# Patient Record
Sex: Female | Born: 1980
Health system: Southern US, Community
[De-identification: ages and names within clinical notes are randomized; demographics above are authoritative.]

## PROBLEM LIST (undated history)

## (undated) DIAGNOSIS — F329 Major depressive disorder, single episode, unspecified: Secondary | ICD-10-CM

## (undated) DIAGNOSIS — I10 Essential (primary) hypertension: Secondary | ICD-10-CM

## (undated) DIAGNOSIS — E7800 Pure hypercholesterolemia, unspecified: Secondary | ICD-10-CM

## (undated) DIAGNOSIS — Z87828 Personal history of other (healed) physical injury and trauma: Secondary | ICD-10-CM

## (undated) DIAGNOSIS — K8020 Calculus of gallbladder without cholecystitis without obstruction: Secondary | ICD-10-CM

## (undated) DIAGNOSIS — K219 Gastro-esophageal reflux disease without esophagitis: Secondary | ICD-10-CM

## (undated) DIAGNOSIS — H04123 Dry eye syndrome of bilateral lacrimal glands: Secondary | ICD-10-CM

## (undated) DIAGNOSIS — F419 Anxiety disorder, unspecified: Secondary | ICD-10-CM

## (undated) DIAGNOSIS — F32A Depression, unspecified: Secondary | ICD-10-CM

## (undated) HISTORY — DX: Essential (primary) hypertension: I10

## (undated) HISTORY — DX: Depression, unspecified: F32.A

## (undated) HISTORY — DX: Anxiety disorder, unspecified: F41.9

## (undated) HISTORY — DX: Personal history of other (healed) physical injury and trauma: Z87.828

## (undated) HISTORY — PX: CHOLECYSTECTOMY: SHX55

## (undated) HISTORY — DX: Gastro-esophageal reflux disease without esophagitis: K21.9

## (undated) HISTORY — DX: Major depressive disorder, single episode, unspecified: F32.9

## (undated) HISTORY — DX: Dry eye syndrome of bilateral lacrimal glands: H04.123

## (undated) HISTORY — DX: Calculus of gallbladder without cholecystitis without obstruction: K80.20

## (undated) HISTORY — PX: REFRACTIVE SURGERY: SHX103

## (undated) HISTORY — DX: Pure hypercholesterolemia, unspecified: E78.00

---

## 2012-08-26 HISTORY — PX: TUBAL LIGATION: SHX77

## 2016-10-12 ENCOUNTER — Ambulatory Visit: Payer: Self-pay | Admitting: Primary Care

## 2016-12-28 ENCOUNTER — Ambulatory Visit (INDEPENDENT_AMBULATORY_CARE_PROVIDER_SITE_OTHER): Payer: 59 | Admitting: Family Medicine

## 2016-12-28 ENCOUNTER — Encounter: Payer: Self-pay | Admitting: Family Medicine

## 2016-12-28 VITALS — BP 124/82 | HR 103 | Temp 98.0°F | Ht 62.5 in | Wt 147.5 lb

## 2016-12-28 DIAGNOSIS — F419 Anxiety disorder, unspecified: Secondary | ICD-10-CM | POA: Diagnosis not present

## 2016-12-28 DIAGNOSIS — M545 Low back pain, unspecified: Secondary | ICD-10-CM

## 2016-12-28 DIAGNOSIS — G8929 Other chronic pain: Secondary | ICD-10-CM

## 2016-12-28 DIAGNOSIS — B350 Tinea barbae and tinea capitis: Secondary | ICD-10-CM

## 2016-12-28 DIAGNOSIS — D649 Anemia, unspecified: Secondary | ICD-10-CM

## 2016-12-28 DIAGNOSIS — Z7689 Persons encountering health services in other specified circumstances: Secondary | ICD-10-CM | POA: Diagnosis not present

## 2016-12-28 MED ORDER — TRAMADOL HCL 50 MG PO TABS: 50.0000 mg | ORAL_TABLET | Freq: Two times a day (BID) | ORAL | 0 refills | Status: DC | PRN

## 2016-12-28 MED ORDER — ALPRAZOLAM 0.5 MG PO TABS: 0.2500 mg | ORAL_TABLET | Freq: Two times a day (BID) | ORAL | 0 refills | Status: DC | PRN

## 2016-12-28 MED ORDER — GRISEOFULVIN ULTRAMICROSIZE 125 MG PO TABS: 375.0000 mg | ORAL_TABLET | Freq: Every day | ORAL | 1 refills | Status: DC

## 2016-12-28 MED ORDER — CYCLOBENZAPRINE HCL 10 MG PO TABS: 10.0000 mg | ORAL_TABLET | Freq: Two times a day (BID) | ORAL | 0 refills | Status: DC | PRN

## 2016-12-28 NOTE — Progress Notes (Signed)
Subjective:    Patient ID: Tina Schmidt, female    DOB: [birth year], 36 y.o.   MRN: [id number]  HPI This is 36 yo female who presents today to establish care. She is married and has 3 boys. 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 She enjoys playing games, dancing and singing.   Tinea capitis- Has had an intermittent fungal infection of scalp. Has used ketaconazole shampoo, but it doesn't seem to work any more. Very itchy and scaly. Has seen dermatology in past who diagnosed her. She is interested in trying oral antifungal. Has excessive flaking and hair loss. Flaking seems to have spread to eyebrows. She wears a wig due to extensive hair loss.   Chronic low back pain- for several years, bothers her when she does excessive heavy lifting at work. Takes occasional tramadol and cyclobenzaprine- 2-4 times per month.   Anxiety- Lost her grandmother last year and has had increased anxiety. Feels that this is improving and she has gone from taking alprazolam 3/x day to 1-2 times per week as needed for sleep.   Had last CPE 07/2015. Last PAP- 2013 normal   Past Medical History:  Diagnosis Date  . Depression   . GERD (gastroesophageal reflux disease)   . Hypertension     Family History  Problem Relation Age of Onset  . Asthma Mother   . Hyperlipidemia Mother   . Hypertension Mother   . Hypertension Father    Social History   Tobacco Use  . Smoking status: Former Games developermoker  . Smokeless tobacco: Never Used  Substance Use Topics  . Alcohol use: Yes    Comment: occ  . Drug use: No      Review of Systems Per HPI    Objective:   Physical Exam  Constitutional: She is oriented to person, place, and time. She appears well-developed and well-nourished. No distress.  HENT:  Head: Normocephalic and atraumatic.  Eyes: Conjunctivae are normal.  Neck: Normal range of motion. Neck supple.  Cardiovascular: Normal rate.  Pulmonary/Chest: Effort normal.  Neurological: She is alert and oriented  to person, place, and time.  Skin: Skin is warm and dry. She is not diaphoretic.  Very receded hair line. Scalp pale and flaky. Forehead slightly flaky.   Psychiatric: She has a normal mood and affect. Her behavior is normal. Judgment and thought content normal.  Vitals reviewed.     BP 124/82 (BP Location: Right Arm, Patient Position: Sitting, Cuff Size: Small)   Pulse (!) 103   Temp 98 F (36.7 C) (Oral)   Ht 5' 2.5" (1.588 m)   Wt 147 lb 8 oz (66.9 kg)   LMP 12/05/2016   SpO2 98%   BMI 26.55 kg/m      Assessment & Plan:  1. Encounter to establish care - Discussed and encouraged healthy lifestyle choices- adequate sleep, regular exercise, stress management and healthy food choices.  - follow up in 1-2 months for CPE with pap  2. Tinea capitis - if no improvement with treatment, will refer to dermatology - provided written information regarding griseofulvin - griseofulvin (GRIS-PEG) 125 MG tablet; Take 3 tablets (375 mg total) by mouth daily.  Dispense: 90 tablet; Refill: 1 - Comprehensive metabolic panel  3. Anemia, unspecified type - reports history of anemia, will check today - CBC  4. Chronic midline low back pain without sciatica - discussed limited use of tramadol and encouraged her to use proper body mechanics with lifting and to do yoga/stretching on regular  basis. Provided written information about back exercises - cyclobenzaprine (FLEXERIL) 10 MG tablet; Take 1 tablet (10 mg total) by mouth 2 (two) times daily as needed for muscle spasms.  Dispense: 20 tablet; Refill: 0 - traMADol (ULTRAM) 50 MG tablet; Take 1 tablet (50 mg total) by mouth 2 (two) times daily as needed.  Dispense: 20 tablet; Refill: 0  5. Anxiety - discussed occasional use os alprazolam and encouraged her to RTC if increased anxiety- can discuss therapy, daily medication.  - ALPRAZolam (XANAX) 0.5 MG tablet; Take 0.5-1 tablets (0.25-0.5 mg total) by mouth 2 (two) times daily as needed for  anxiety.  Dispense: 20 tablet; Refill: 0   Olean Reeeborah Conni Knighton, FNP-BC  [facility], [facility]  12/28/2016 5:48 PM

## 2016-12-28 NOTE — Patient Instructions (Signed)
It was a pleasure to meet you today! I look forward to partnering with you for your health care needs  Please schedule your complete physical for Jan/Feb  Griseofulvin tablets or capsules What is this medicine? GRISEOFULVIN (gri see oh FUL vin) is an antifungal medicine. It is used to treat certain kinds of fungal or yeast infections of the skin, hair, or nails. This medicine may be used for other purposes; ask your health care provider or pharmacist if you have questions. COMMON BRAND NAME(S): Fulvicin P/G, Fulvicin U/F, Grifulvin V, Gris-Peg, Grisactin What should I tell my health care provider before I take this medicine? They need to know if you have any of these conditions: -liver disease -porphyria -systemic lupus erythematosus (SLE) -an unusual or allergic reaction to griseofulvin, penicillin, other foods, dyes or preservatives -pregnant or trying to get pregnant -breast-feeding How should I use this medicine? Take this medicine by mouth with a glass of water. Follow the directions on the prescription label. Take with or without food. Take your medicine at regular intervals. Do not take your medicine more often than directed. Do not skip doses or stop your medicine early even if you feel better. Do not stop taking except on your doctor's advice. Talk to your pediatrician regarding the use of this medicine in children. While this drug may be prescribed for selected conditions, precautions do apply. Overdosage: If you think you have taken too much of this medicine contact a poison control center or emergency room at once. NOTE: This medicine is only for you. Do not share this medicine with others. What if I miss a dose? If you miss a dose, take it as soon as you can. If it is almost time for your next dose, take only that dose. Do not take double or extra doses. What may interact with this medicine? -aspirin and aspirin-like medicines -barbiturate medicines for sleep or  seizures -cyclosporine -female hormones, like estrogens or progestins and birth control pills -warfarin This list may not describe all possible interactions. Give your health care provider a list of all the medicines, herbs, non-prescription drugs, or dietary supplements you use. Also tell them if you smoke, drink alcohol, or use illegal drugs. Some items may interact with your medicine. What should I watch for while using this medicine? Visit your doctor or health care professional for regular check ups. Tell your doctor or health care professional if your symptoms do not improve or if they get worse. Some fungal infections need many weeks or months of treatment to cure. Follow your doctor's instructions on how to care for the infection. You may need to use another medicine on your skin while you are taking this medicine. This medicine can make you more sensitive to the sun. Keep out of the sun. If you cannot avoid being in the sun, wear protective clothing and use sunscreen. Do not use sun lamps or tanning beds/booths. Birth control pills may not work properly while you are taking this medicine. Talk to your doctor about using an extra method of birth control. What side effects may I notice from receiving this medicine? Side effects that you should report to your doctor or health care professional as soon as possible: -allergic reactions like skin rash or hives, swelling of the face, lips, or tongue -confusion -dark urine -fever or infection -loss of appetite -mouth sores, white patches -skin rash, redness, blistering, or peeling of skin -tingling or numbness in the hands or feet -trouble breathing -unusually weak or tired -  yellowing of skin or eyes Side effects that usually do not require medical attention (report to your doctor or health care professional if they continue or are bothersome): -difficulty sleeping -dizziness -headache -nausea, vomiting -stomach pain This list may not  describe all possible side effects. Call your doctor for medical advice about side effects. You may report side effects to FDA at 1-800-FDA-1088. Where should I keep my medicine? Keep out of the reach of children. Store at room temperature between 15 and 30 degrees C (59 and 86 degrees F). Keep container tightly closed. Throw away any unused medicine after the expiration date. NOTE: This sheet is a summary. It may not cover all possible information. If you have questions about this medicine, talk to your doctor, pharmacist, or health care provider.  2018 Elsevier/Gold Standard (2012-06-02 14:42:56)  Back Exercises The following exercises strengthen the muscles that help to support the back. They also help to keep the lower back flexible. Doing these exercises can help to prevent back pain or lessen existing pain. If you have back pain or discomfort, try doing these exercises 2-3 times each day or as told by your health care provider. When the pain goes away, do them once each day, but increase the number of times that you repeat the steps for each exercise (do more repetitions). If you do not have back pain or discomfort, do these exercises once each day or as told by your health care provider. Exercises Single Knee to Chest  Repeat these steps 3-5 times for each leg: 1. Lie on your back on a firm bed or the floor with your legs extended. 2. Bring one knee to your chest. Your other leg should stay extended and in contact with the floor. 3. Hold your knee in place by grabbing your knee or thigh. 4. Pull on your knee until you feel a gentle stretch in your lower back. 5. Hold the stretch for 10-30 seconds. 6. Slowly release and straighten your leg.  Pelvic Tilt  Repeat these steps 5-10 times: 1. Lie on your back on a firm bed or the floor with your legs extended. 2. Bend your knees so they are pointing toward the ceiling and your feet are flat on the floor. 3. Tighten your lower abdominal  muscles to press your lower back against the floor. This motion will tilt your pelvis so your tailbone points up toward the ceiling instead of pointing to your feet or the floor. 4. With gentle tension and even breathing, hold this position for 5-10 seconds.  Cat-Cow  Repeat these steps until your lower back becomes more flexible: 1. Get into a hands-and-knees position on a firm surface. Keep your hands under your shoulders, and keep your knees under your hips. You may place padding under your knees for comfort. 2. Let your head hang down, and point your tailbone toward the floor so your lower back becomes rounded like the back of a cat. 3. Hold this position for 5 seconds. 4. Slowly lift your head and point your tailbone up toward the ceiling so your back forms a sagging arch like the back of a cow. 5. Hold this position for 5 seconds.  Press-Ups  Repeat these steps 5-10 times: 1. Lie on your abdomen (face-down) on the floor. 2. Place your palms near your head, about shoulder-width apart. 3. While you keep your back as relaxed as possible and keep your hips on the floor, slowly straighten your arms to raise the top half of your  body and lift your shoulders. Do not use your back muscles to raise your upper torso. You may adjust the placement of your hands to make yourself more comfortable. 4. Hold this position for 5 seconds while you keep your back relaxed. 5. Slowly return to lying flat on the floor.  Bridges  Repeat these steps 10 times: 1. Lie on your back on a firm surface. 2. Bend your knees so they are pointing toward the ceiling and your feet are flat on the floor. 3. Tighten your buttocks muscles and lift your buttocks off of the floor until your waist is at almost the same height as your knees. You should feel the muscles working in your buttocks and the back of your thighs. If you do not feel these muscles, slide your feet 1-2 inches farther away from your buttocks. 4. Hold this  position for 3-5 seconds. 5. Slowly lower your hips to the starting position, and allow your buttocks muscles to relax completely.  If this exercise is too easy, try doing it with your arms crossed over your chest. Abdominal Crunches  Repeat these steps 5-10 times: 1. Lie on your back on a firm bed or the floor with your legs extended. 2. Bend your knees so they are pointing toward the ceiling and your feet are flat on the floor. 3. Cross your arms over your chest. 4. Tip your chin slightly toward your chest without bending your neck. 5. Tighten your abdominal muscles and slowly raise your trunk (torso) high enough to lift your shoulder blades a tiny bit off of the floor. Avoid raising your torso higher than that, because it can put too much stress on your low back and it does not help to strengthen your abdominal muscles. 6. Slowly return to your starting position.  Back Lifts Repeat these steps 5-10 times: 1. Lie on your abdomen (face-down) with your arms at your sides, and rest your forehead on the floor. 2. Tighten the muscles in your legs and your buttocks. 3. Slowly lift your chest off of the floor while you keep your hips pressed to the floor. Keep the back of your head in line with the curve in your back. Your eyes should be looking at the floor. 4. Hold this position for 3-5 seconds. 5. Slowly return to your starting position.  Contact a health care provider if:  Your back pain or discomfort gets much worse when you do an exercise.  Your back pain or discomfort does not lessen within 2 hours after you exercise. If you have any of these problems, stop doing these exercises right away. Do not do them again unless your health care provider says that you can. Get help right away if:  You develop sudden, severe back pain. If this happens, stop doing the exercises right away. Do not do them again unless your health care provider says that you can. This information is not intended to  replace advice given to you by your health care provider. Make sure you discuss any questions you have with your health care provider. Document Released: 02/09/2004 Document Revised: 05/11/2015 Document Reviewed: 02/25/2014 Elsevier Interactive Patient Education  2017 [employer]

## 2016-12-29 LAB — CBC
HCT: 35.8 % (ref 35.0–45.0)
HEMOGLOBIN: 12.1 g/dL (ref 11.7–15.5)
MCH: 27.5 pg (ref 27.0–33.0)
MCHC: 33.8 g/dL (ref 32.0–36.0)
MCV: 81.4 fL (ref 80.0–100.0)
MPV: 10.2 fL (ref 7.5–12.5)
PLATELETS: 352 10*3/uL (ref 140–400)
RBC: 4.4 10*6/uL (ref 3.80–5.10)
RDW: 13 % (ref 11.0–15.0)
WBC: 7.7 10*3/uL (ref 3.8–10.8)

## 2016-12-29 LAB — COMPREHENSIVE METABOLIC PANEL
AG RATIO: 1.5 (calc) (ref 1.0–2.5)
ALBUMIN MSPROF: 4.5 g/dL (ref 3.6–5.1)
ALT: 10 U/L (ref 6–29)
AST: 15 U/L (ref 10–30)
Alkaline phosphatase (APISO): 65 U/L (ref 33–115)
BUN: 11 mg/dL (ref 7–25)
CHLORIDE: 104 mmol/L (ref 98–110)
CO2: 30 mmol/L (ref 20–32)
CREATININE: 0.85 mg/dL (ref 0.50–1.10)
Calcium: 10.1 mg/dL (ref 8.6–10.2)
GLOBULIN: 3 g/dL (ref 1.9–3.7)
GLUCOSE: 92 mg/dL (ref 65–99)
POTASSIUM: 3.9 mmol/L (ref 3.5–5.3)
SODIUM: 140 mmol/L (ref 135–146)
TOTAL PROTEIN: 7.5 g/dL (ref 6.1–8.1)
Total Bilirubin: 0.3 mg/dL (ref 0.2–1.2)

## 2017-02-07 ENCOUNTER — Encounter: Payer: Self-pay | Admitting: Family Medicine

## 2017-02-07 ENCOUNTER — Other Ambulatory Visit: Payer: Self-pay | Admitting: Family Medicine

## 2017-02-07 DIAGNOSIS — M545 Low back pain: Secondary | ICD-10-CM

## 2017-02-07 DIAGNOSIS — G8929 Other chronic pain: Secondary | ICD-10-CM

## 2017-02-07 DIAGNOSIS — F419 Anxiety disorder, unspecified: Secondary | ICD-10-CM

## 2017-02-07 MED ORDER — CYCLOBENZAPRINE HCL 10 MG PO TABS: 10.0000 mg | ORAL_TABLET | Freq: Two times a day (BID) | ORAL | 0 refills | Status: DC | PRN

## 2017-02-07 MED ORDER — ALPRAZOLAM 0.5 MG PO TABS: 0.2500 mg | ORAL_TABLET | Freq: Two times a day (BID) | ORAL | 0 refills | Status: DC | PRN

## 2017-02-07 NOTE — Progress Notes (Signed)
Rx called in to requested pharmacy 

## 2017-02-15 ENCOUNTER — Other Ambulatory Visit: Payer: 59

## 2017-02-22 ENCOUNTER — Other Ambulatory Visit (HOSPITAL_COMMUNITY)
Admission: RE | Admit: 2017-02-22 | Discharge: 2017-02-22 | Disposition: A | Discharge status: Home / Self Care | Payer: 59 | Source: Ambulatory Visit | Attending: Family Medicine | Admitting: Family Medicine

## 2017-02-22 ENCOUNTER — Ambulatory Visit (INDEPENDENT_AMBULATORY_CARE_PROVIDER_SITE_OTHER): Payer: 59 | Admitting: Family Medicine

## 2017-02-22 ENCOUNTER — Encounter: Payer: Self-pay | Admitting: Family Medicine

## 2017-02-22 VITALS — BP 122/82 | HR 98 | Temp 98.0°F | Ht 62.5 in | Wt 146.5 lb

## 2017-02-22 DIAGNOSIS — Z124 Encounter for screening for malignant neoplasm of cervix: Secondary | ICD-10-CM | POA: Diagnosis present

## 2017-02-22 DIAGNOSIS — B372 Candidiasis of skin and nail: Secondary | ICD-10-CM | POA: Diagnosis not present

## 2017-02-22 DIAGNOSIS — K219 Gastro-esophageal reflux disease without esophagitis: Secondary | ICD-10-CM | POA: Insufficient documentation

## 2017-02-22 DIAGNOSIS — F329 Major depressive disorder, single episode, unspecified: Secondary | ICD-10-CM | POA: Insufficient documentation

## 2017-02-22 DIAGNOSIS — I10 Essential (primary) hypertension: Secondary | ICD-10-CM | POA: Insufficient documentation

## 2017-02-22 DIAGNOSIS — Z0000 Encounter for general adult medical examination without abnormal findings: Secondary | ICD-10-CM

## 2017-02-22 DIAGNOSIS — Z0001 Encounter for general adult medical examination with abnormal findings: Secondary | ICD-10-CM | POA: Diagnosis not present

## 2017-02-22 MED ORDER — NYSTATIN 100000 UNIT/GM EX CREA: 1.0000 "application " | TOPICAL_CREAM | Freq: Two times a day (BID) | CUTANEOUS | 0 refills | Status: DC

## 2017-02-22 NOTE — Patient Instructions (Signed)
I have sent a cream to your pharmacy, apply under your breasts twice a day for 7-10 days  Once the discoloration is gone, can use medicated powder Zeasorb  Try melatonin nightly for sleep, drink more water early in the day  Follow up your skin condition in 6 months, sooner if needed  Keeping You Healthy  Get These Tests 1. Blood Pressure- Have your blood pressure checked once a year by your health care provider.  Normal blood pressure is 120/80. 2. Weight- Have your body mass index (BMI) calculated to screen for obesity.  BMI is measure of body fat based on height and weight.  You can also calculate your own BMI at https://www.west-esparza.com/www.nhlbisupport.com/bmi/. 3. Cholesterol- Have your cholesterol checked every 5 years starting at age 38 then yearly starting at age 38. 4. Chlamydia, HIV, and other sexually transmitted diseases- Get screened every year until age 725, then within three months of each new sexual provider. 5. Pap Test - Every 1-5 years; discuss with your health care provider. 6. Mammogram- Every 1-2 years starting at age 38--50  Take these medicines  Calcium with Vitamin D-Your body needs 1200 mg of Calcium each day and [phone] IU of Vitamin D daily.  Your body can only absorb 500 mg of Calcium at a time so Calcium must be taken in 2 or 3 divided doses throughout the day.  Multivitamin with folic acid- Once daily if it is possible for you to become pregnant.  Get these Immunizations  Gardasil-Series of three doses; prevents HPV related illness such as genital warts and cervical cancer.  Menactra-Single dose; prevents meningitis.  Tetanus shot- Every 10 years.  Flu shot-Every year.  Take these steps 1. Do not smoke-Your healthcare provider can help you quit.  For tips on how to quit go to www.smokefree.gov or call 1-800 QUITNOW. 2. Be physically active- Exercise 5 days a week for at least 30 minutes.  If you are not already physically active, start slow and gradually work up to 30  minutes of moderate physical activity.  Examples of moderate activity include walking briskly, dancing, swimming, bicycling, etc. 3. Breast Cancer- A self breast exam every month is important for early detection of breast cancer.  For more information and instruction on self breast exams, ask your healthcare provider or [place].eswww.womenshealth.gov/faq/breast-self-exam.cfm. 4. Eat a healthy diet- Eat a variety of healthy foods such as fruits, vegetables, whole grains, low fat milk, low fat cheeses, yogurt, lean meats, poultry and fish, beans, nuts, tofu, etc.  For more information go to www. Thenutritionsource.org 5. Drink alcohol in moderation- Limit alcohol intake to one drink or less per day. Never drink and drive. 6. Depression- Your emotional health is as important as your physical health.  If you're feeling down or losing interest in things you normally enjoy please talk to your healthcare provider about being screened for depression. 7. Dental visit- Brush and floss your teeth twice daily; visit your dentist twice a year. 8. Eye doctor- Get an eye exam at least every 2 years. 9. Helmet use- Always wear a helmet when riding a bicycle, motorcycle, rollerblading or skateboarding. 10. Safe sex- If you may be exposed to sexually transmitted infections, use a condom. 11. Seat belts- Seat belts can save your live; always wear one. 12. Smoke/Carbon Monoxide detectors- These detectors need to be installed on the appropriate level of your home. Replace batteries at least once a year. 13. Skin cancer- When out in the sun please cover up and use sunscreen 15  SPF or higher. 14. Violence- If anyone is threatening or hurting you, please tell your healthcare provider.

## 2017-02-22 NOTE — Progress Notes (Signed)
Subjective:    Patient ID: Tina Schmidt, female    DOB: 02/10/1980, 37 y.o.   MRN: [id number]  HPI This is a 37 yo female who presents today for CPE.   Last CPE- 7/17 Mammo- NA Pap- 2013- nml Tdap- unsure Flu- annual Eye- regular Dental- regular Exercise-   Has been taking griseoflulvin for tinea capitus. Has noticed decreased itching, feels that her scalp has improved.    Past Medical History:  Diagnosis Date  . Depression   . GERD (gastroesophageal reflux disease)   . Hypertension    Past Surgical History:  Procedure Laterality Date  . CESAREAN SECTION    . CHOLECYSTECTOMY     Family History  Problem Relation Age of Onset  . Asthma Mother   . Hyperlipidemia Mother   . Hypertension Mother   . Hypertension Father    Social History   Tobacco Use  . Smoking status: Former Games developer  . Smokeless tobacco: Never Used  Substance Use Topics  . Alcohol use: Yes    Comment: occ  . Drug use: No      Review of Systems  Constitutional: Negative for fatigue and fever.  HENT: Negative.   Eyes: Negative.   Respiratory: Negative.   Cardiovascular: Negative.   Gastrointestinal: Negative.   Genitourinary: Negative.   Musculoskeletal: Negative.   Psychiatric/Behavioral: Positive for sleep disturbance (goes to sleep ok, awakens and can't go back to sleep).       Objective:   Physical Exam Physical Exam  Constitutional: She is oriented to person, place, and time. She appears well-developed and well-nourished. No distress.  HENT:  Head: Normocephalic and atraumatic.  Right Ear: External ear normal.  Left Ear: External ear normal.  Nose: Nose normal.  Mouth/Throat: Oropharynx is clear and moist. No oropharyngeal exudate.  Eyes: Conjunctivae are normal. Pupils are equal, round, and reactive to light.  Neck: Normal range of motion. Neck supple. No JVD present. No thyromegaly present.  Cardiovascular: Normal rate, regular rhythm, normal heart sounds and intact  distal pulses.   Pulmonary/Chest: Effort normal and breath sounds normal. Right breast exhibits no inverted nipple, no mass, no nipple discharge, no skin change and no tenderness. Left breast exhibits no inverted nipple, no mass, no nipple discharge, no skin change and no tenderness. Breasts are symmetrical.  Abdominal: Soft. Bowel sounds are normal. She exhibits no distension and no mass. There is no tenderness. There is no rebound and no guarding.  Genitourinary: Vagina normal. Pelvic exam was performed with patient supine. There is no rash, tenderness, lesion or injury on the right labia. There is no rash, tenderness, lesion or injury on the left labia. Cervix exhibits no motion tenderness. Thin grey discharge.  Musculoskeletal: Normal range of motion. She exhibits no edema or tenderness.  Lymphadenopathy:    She has no cervical adenopathy.  Neurological: She is alert and oriented to person, place, and time. She has normal reflexes.  Skin: Skin is warm and dry. She is not diaphoretic. Erythema under both breasts.  Psychiatric: She has a normal mood and affect. Her behavior is normal. Judgment and thought content normal.  Vitals reviewed.     BP 122/82   Pulse 98   Temp 98 F (36.7 C) (Oral)   Ht 5' 2.5" (1.588 m)   Wt 146 lb 8 oz (66.5 kg)   LMP 02/09/2017   SpO2 99%   BMI 26.37 kg/m  Wt Readings from Last 3 Encounters:  02/22/17 146 lb 8 oz (66.5 kg)  12/28/16 147 lb 8 oz (66.9 kg)   Depression screen PHQ 2/9 12/28/2016  Decreased Interest 0  Down, Depressed, Hopeless 0  PHQ - 2 Score 0       Assessment & Plan:  1. Annual physical exam - Discussed and encouraged healthy lifestyle choices- adequate sleep, regular exercise, stress management and healthy food choices.   2. Yeast dermatitis - discussed keeping area clean and dry - nystatin cream (MYCOSTATIN); Apply 1 application topically 2 (two) times daily.  Dispense: 30 g; Refill: 0  3. Screening for cervical cancer -  Cytology - PAP([place])  - Follow up 6 months Olean Reeeborah Nakaiya Beddow, FNP-BC  [facility], [facility]  02/25/2017 7:59 AM

## 2017-02-25 ENCOUNTER — Encounter: Payer: Self-pay | Admitting: Family Medicine

## 2017-02-26 LAB — CYTOLOGY - PAP
Adequacy: ABSENT
Candida vaginitis: NEGATIVE
Chlamydia: NEGATIVE
Diagnosis: NEGATIVE
HPV: NOT DETECTED
Neisseria Gonorrhea: NEGATIVE
Trichomonas: NEGATIVE

## 2017-03-19 ENCOUNTER — Encounter: Payer: Self-pay | Admitting: Family Medicine

## 2017-03-19 DIAGNOSIS — F419 Anxiety disorder, unspecified: Secondary | ICD-10-CM

## 2017-03-19 DIAGNOSIS — M545 Low back pain, unspecified: Secondary | ICD-10-CM

## 2017-03-19 DIAGNOSIS — G8929 Other chronic pain: Secondary | ICD-10-CM

## 2017-03-19 NOTE — Telephone Encounter (Signed)
Last OV 02/22/2017. Last Rx 02/07/2017 xanax and flexeril. Tramadol last filled 12/28/2016

## 2017-03-20 ENCOUNTER — Other Ambulatory Visit: Payer: Self-pay | Admitting: Family Medicine

## 2017-03-20 ENCOUNTER — Encounter: Payer: Self-pay | Admitting: Family Medicine

## 2017-03-20 DIAGNOSIS — M545 Low back pain, unspecified: Secondary | ICD-10-CM

## 2017-03-20 DIAGNOSIS — G8929 Other chronic pain: Secondary | ICD-10-CM

## 2017-03-20 DIAGNOSIS — F419 Anxiety disorder, unspecified: Secondary | ICD-10-CM

## 2017-03-20 MED ORDER — TRAMADOL HCL 50 MG PO TABS: 50.0000 mg | ORAL_TABLET | Freq: Two times a day (BID) | ORAL | 0 refills | Status: DC | PRN

## 2017-03-20 MED ORDER — CYCLOBENZAPRINE HCL 10 MG PO TABS: 10.0000 mg | ORAL_TABLET | Freq: Two times a day (BID) | ORAL | 0 refills | Status: DC | PRN

## 2017-03-20 MED ORDER — ALPRAZOLAM 0.5 MG PO TABS: 0.2500 mg | ORAL_TABLET | Freq: Two times a day (BID) | ORAL | 0 refills | Status: DC | PRN

## 2017-03-21 NOTE — Telephone Encounter (Signed)
Rx called in to requested pharmacy 

## 2017-05-01 ENCOUNTER — Other Ambulatory Visit: Payer: Self-pay | Admitting: Family Medicine

## 2017-05-01 DIAGNOSIS — G8929 Other chronic pain: Secondary | ICD-10-CM

## 2017-05-01 DIAGNOSIS — F419 Anxiety disorder, unspecified: Secondary | ICD-10-CM

## 2017-05-01 DIAGNOSIS — M545 Low back pain: Principal | ICD-10-CM

## 2017-05-01 MED ORDER — ALPRAZOLAM 0.5 MG PO TABS: 0.2500 mg | ORAL_TABLET | Freq: Two times a day (BID) | ORAL | 0 refills | Status: DC | PRN

## 2017-05-01 MED ORDER — CYCLOBENZAPRINE HCL 10 MG PO TABS: 10.0000 mg | ORAL_TABLET | Freq: Two times a day (BID) | ORAL | 0 refills | Status: DC | PRN

## 2017-05-01 MED ORDER — TRAMADOL HCL 50 MG PO TABS: 50.0000 mg | ORAL_TABLET | Freq: Two times a day (BID) | ORAL | 0 refills | Status: DC | PRN

## 2017-05-01 NOTE — Telephone Encounter (Signed)
Please Advise

## 2017-06-11 ENCOUNTER — Other Ambulatory Visit: Payer: Self-pay | Admitting: Family Medicine

## 2017-06-11 DIAGNOSIS — M545 Low back pain: Principal | ICD-10-CM

## 2017-06-11 DIAGNOSIS — G8929 Other chronic pain: Secondary | ICD-10-CM

## 2017-06-11 DIAGNOSIS — F419 Anxiety disorder, unspecified: Secondary | ICD-10-CM

## 2017-06-13 ENCOUNTER — Other Ambulatory Visit: Payer: Self-pay | Admitting: Family Medicine

## 2017-06-13 DIAGNOSIS — G8929 Other chronic pain: Secondary | ICD-10-CM

## 2017-06-13 DIAGNOSIS — F419 Anxiety disorder, unspecified: Secondary | ICD-10-CM

## 2017-06-13 DIAGNOSIS — M545 Low back pain: Principal | ICD-10-CM

## 2017-06-14 MED ORDER — ALPRAZOLAM 0.5 MG PO TABS
0.2500 mg | ORAL_TABLET | Freq: Two times a day (BID) | ORAL | 0 refills | Status: DC | PRN
Start: 2017-06-14 — End: 2018-10-03

## 2017-06-14 MED ORDER — CYCLOBENZAPRINE HCL 10 MG PO TABS: 10.0000 mg | ORAL_TABLET | Freq: Two times a day (BID) | ORAL | 0 refills | Status: DC | PRN

## 2017-06-14 MED ORDER — TRAMADOL HCL 50 MG PO TABS: 50.0000 mg | ORAL_TABLET | Freq: Two times a day (BID) | ORAL | 0 refills | Status: DC | PRN

## 2017-06-14 NOTE — Telephone Encounter (Signed)
Last filled 04/28/17 # 20 no refills for all prescriptions

## 2017-07-19 ENCOUNTER — Encounter: Payer: Self-pay | Admitting: Family Medicine

## 2017-07-22 ENCOUNTER — Ambulatory Visit (INDEPENDENT_AMBULATORY_CARE_PROVIDER_SITE_OTHER): Payer: 59 | Admitting: Family Medicine

## 2017-07-22 ENCOUNTER — Encounter: Payer: Self-pay | Admitting: Family Medicine

## 2017-07-22 VITALS — BP 108/70 | HR 82 | Temp 97.8°F | Ht 62.5 in | Wt 156.0 lb

## 2017-07-22 DIAGNOSIS — F419 Anxiety disorder, unspecified: Secondary | ICD-10-CM | POA: Diagnosis not present

## 2017-07-22 MED ORDER — SERTRALINE HCL 50 MG PO TABS: 50.0000 mg | ORAL_TABLET | Freq: Every day | ORAL | 1 refills | Status: DC

## 2017-07-22 NOTE — Progress Notes (Signed)
   Subjective:    Patient ID: Tina Schmidt, female    DOB: [birth year], 37 y.o.   MRN: [id number]  HPI This is a 37 yo female who presents today with increased anxiety over the last month. Started a new job 3 months ago and has increased stress. Some family members are dealing with health problems. She feels like she doesn't have a reason for her stress as she has good support, her children are healthy. Feels overwhelmed, free floating anxiety, snapping at her family. Sleeps ok some night, other nights has frequent awakening, difficulty going back to sleep. Takes occasional 0.25- 0.5 mg alprazolam prn.   Past Medical History:  Diagnosis Date  . Depression   . GERD (gastroesophageal reflux disease)   . Hypertension    Past Surgical History:  Procedure Laterality Date  . CESAREAN SECTION    . CHOLECYSTECTOMY     Family History  Problem Relation Age of Onset  . Asthma Mother   . Hyperlipidemia Mother   . Hypertension Mother   . Hypertension Father    Social History   Tobacco Use  . Smoking status: Former Games developermoker  . Smokeless tobacco: Never Used  Substance Use Topics  . Alcohol use: Yes    Comment: occ  . Drug use: No      Review of Systems Per HPI    Objective:   Physical Exam  Constitutional: She is oriented to person, place, and time. She appears well-developed and well-nourished.  HENT:  Head: Normocephalic and atraumatic.  Cardiovascular: Normal rate.  Pulmonary/Chest: Effort normal.  Neurological: She is alert and oriented to person, place, and time.  Skin: Skin is warm and dry.  Psychiatric: She has a normal mood and affect. Her behavior is normal. Judgment and thought content normal.  Vitals reviewed.     BP 108/70 (BP Location: Left Arm, Patient Position: Sitting, Cuff Size: Normal)   Pulse 82   Temp 97.8 F (36.6 C) (Oral)   Ht 5' 2.5" (1.588 m)   Wt 156 lb (70.8 kg)   LMP 07/18/2017   SpO2 99%   BMI 28.08 kg/m  Wt Readings from Last 3  Encounters:  07/22/17 156 lb (70.8 kg)  02/22/17 146 lb 8 oz (66.5 kg)  12/28/16 147 lb 8 oz (66.9 kg)   GAD 7 : Generalized Anxiety Score 07/22/2017  Nervous, Anxious, on Edge 3  Control/stop worrying 3  Worry too much - different things 3  Trouble relaxing 3  Restless 3  Easily annoyed or irritable 3  Afraid - awful might happen 3  Total GAD 7 Score 21  Anxiety Difficulty Very difficult        Assessment & Plan:  1. Anxiety - Provided written and verbal information regarding diagnosis and treatment. - provided information about mindfulness and discussed importance of adequate sleep, exercise, healthy food choices - discussed medication usage, expectations of therapy and possible side effects - follow up in office in 2 months, sooner if worsening symptoms, problems with medication, discussed dosing and can be increased to 100 mg if 50 not working - sertraline (ZOLOFT) 50 MG tablet; Take 1 tablet (50 mg total) by mouth daily.  Dispense: 90 tablet; Refill: 1   Olean Reeeborah Sly Parlee, FNP-BC  [facility] [place], [facility]  07/22/2017 8:51 AM

## 2017-07-22 NOTE — Patient Instructions (Signed)
Good to see you today I have sent in some sertraline for you Start with one tablet at bedtime, if it upsets your stomach can take 1/2 tablet for a couple of days Be in touch in the next couple of weeks  Follow up in 2 months  Mindfulness-Based Stress Reduction Mindfulness-based stress reduction (MBSR) is a program that helps people learn to practice mindfulness. Mindfulness is the practice of intentionally paying attention to the present moment. It can be learned and practiced through techniques such as education, breathing exercises, meditation, and yoga. MBSR includes several mindfulness techniques in one program. MBSR works best when you understand the treatment, are willing to try new things, and can commit to spending time practicing what you learn. MBSR training may include learning about:  How your emotions, thoughts, and reactions affect your body.  New ways to respond to things that cause negative thoughts to start (triggers).  How to notice your thoughts and let go of them.  Practicing awareness of everyday things that you normally do without thinking.  The techniques and goals of different types of meditation.  What are the benefits of MBSR? MBSR can have many benefits, which include helping you to:  Develop self-awareness. This refers to knowing and understanding yourself.  Learn skills and attitudes that help you to participate in your own health care.  Learn new ways to care for yourself.  Be more accepting about how things are, and let things go.  Be less judgmental and approach things with an open mind.  Be patient with yourself and trust yourself more.  MBSR has also been shown to:  Reduce negative emotions, such as depression and anxiety.  Improve memory and focus.  Change how you sense and approach pain.  Boost your body's ability to fight infections.  Help you connect better with other people.  Improve your sense of well-being.  Follow these  instructions at home:  Find a local in-person or online MBSR program.  Set aside some time regularly for mindfulness practice.  Find a mindfulness practice that works best for you. This may include one or more of the following: ? Meditation. Meditation involves focusing your mind on a certain thought or activity. ? Breathing awareness exercises. These help you to stay present by focusing on your breath. ? Body scan. For this practice, you lie down and pay attention to each part of your body from head to toe. You can identify tension and soreness and intentionally relax parts of your body. ? Yoga. Yoga involves stretching and breathing, and it can improve your ability to move and be flexible. It can also provide an experience of testing your body's limits, which can help you release stress. ? Mindful eating. This way of eating involves focusing on the taste, texture, color, and smell of each bite of food. Because this slows down eating and helps you feel full sooner, it can be an important part of a weight-loss plan.  Find a podcast or recording that provides guidance for breathing awareness, body scan, or meditation exercises. You can listen to these any time when you have a free moment to rest without distractions.  Follow your treatment plan as told by your health care provider. This may include taking regular medicines and making changes to your diet or lifestyle as recommended. How to practice mindfulness To do a basic awareness exercise:  Find a comfortable place to sit.  Pay attention to the present moment. Observe your thoughts, feelings, and surroundings just as  they are.  Avoid placing judgment on yourself, your feelings, or your surroundings. Make note of any judgment that comes up, and let it go.  Your mind may wander, and that is okay. Make note of when your thoughts drift, and return your attention to the present moment.  To do basic mindfulness meditation:  Find a  comfortable place to sit. This may include a stable chair or a firm floor cushion. ? Sit upright with your back straight. Let your arms fall next to your side with your hands resting on your legs. ? If sitting in a chair, rest your feet flat on the floor. ? If sitting on a cushion, cross your legs in front of you.  Keep your head in a neutral position with your chin dropped slightly. Relax your jaw and rest the tip of your tongue on the roof of your mouth. Drop your gaze to the floor. You can close your eyes if you like.  Breathe normally and pay attention to your breath. Feel the air moving in and out of your nose. Feel your belly expanding and relaxing with each breath.  Your mind may wander, and that is okay. Make note of when your thoughts drift, and return your attention to your breath.  Avoid placing judgment on yourself, your feelings, or your surroundings. Make note of any judgment or feelings that come up, let them go, and bring your attention back to your breath.  When you are ready, lift your gaze or open your eyes. Pay attention to how your body feels after the meditation.  Where to find more information: You can find more information about MBSR from:  Your health care provider.  Community-based meditation centers or programs.  Programs offered near you.  Summary  Mindfulness-based stress reduction (MBSR) is a program that teaches you how to intentionally pay attention to the present moment. It is used with other treatments to help you cope better with daily stress, emotions, and pain.  MBSR focuses on developing self-awareness, which allows you to respond to life stress without judgment or negative emotions.  MBSR programs may involve learning different mindfulness practices, such as breathing exercises, meditation, yoga, body scan, or mindful eating. Find a mindfulness practice that works best for you, and set aside time for it on a regular basis. This information is not  intended to replace advice given to you by your health care provider. Make sure you discuss any questions you have with your health care provider. Document Released: 05/10/2016 Document Revised: 05/10/2016 Document Reviewed: 05/10/2016 Elsevier Interactive Patient Education  [employer].

## 2017-07-23 ENCOUNTER — Other Ambulatory Visit: Payer: Self-pay | Admitting: Family Medicine

## 2017-07-23 DIAGNOSIS — G8929 Other chronic pain: Secondary | ICD-10-CM

## 2017-07-23 DIAGNOSIS — M545 Low back pain: Principal | ICD-10-CM

## 2017-07-29 ENCOUNTER — Encounter: Payer: Self-pay | Admitting: Family Medicine

## 2017-07-31 ENCOUNTER — Other Ambulatory Visit: Payer: Self-pay | Admitting: Family Medicine

## 2017-07-31 DIAGNOSIS — F419 Anxiety disorder, unspecified: Secondary | ICD-10-CM

## 2017-07-31 MED ORDER — ESCITALOPRAM OXALATE 10 MG PO TABS: 10.0000 mg | ORAL_TABLET | Freq: Every day | ORAL | 2 refills | Status: DC

## 2017-08-28 ENCOUNTER — Encounter: Payer: Self-pay | Admitting: Family Medicine

## 2017-09-23 ENCOUNTER — Ambulatory Visit: Payer: 59 | Admitting: Family Medicine

## 2017-10-10 ENCOUNTER — Other Ambulatory Visit: Payer: Self-pay | Admitting: Family Medicine

## 2017-10-10 DIAGNOSIS — M545 Low back pain: Principal | ICD-10-CM

## 2017-10-10 DIAGNOSIS — G8929 Other chronic pain: Secondary | ICD-10-CM

## 2017-10-10 NOTE — Telephone Encounter (Signed)
Name of Medication: Tramadol 50 mg Name of Pharmacy: [facility] [address] Last Fill or Written Date and Quantity: # 20 on 06/14/17 Last Office Visit and Type: 07/22/17 acute for anxiety Last annual exam on 02/22/17 Next Office Visit and Type: none scheduled Last Controlled Substance Agreement Date: none Last [id number]   Name of Medication: Flexeril 10 mg Name of Pharmacy: [facility] [address] Last Fill or Written Date and Quantity: # 20 on 06/14/17 Last Office Visit and Type: 07/22/17 acute for anxiety Last annual exam on 02/22/17 Next Office Visit and Type: none scheduled Last Controlled Substance Agreement Date: none Last [id number]

## 2017-10-11 MED ORDER — CYCLOBENZAPRINE HCL 10 MG PO TABS: 10.0000 mg | ORAL_TABLET | Freq: Two times a day (BID) | ORAL | 0 refills | Status: DC | PRN

## 2017-10-11 MED ORDER — TRAMADOL HCL 50 MG PO TABS: 50.0000 mg | ORAL_TABLET | Freq: Two times a day (BID) | ORAL | 0 refills | Status: DC | PRN

## 2017-11-04 ENCOUNTER — Encounter: Payer: Self-pay | Admitting: Family Medicine

## 2017-11-11 ENCOUNTER — Other Ambulatory Visit: Payer: Self-pay | Admitting: Family Medicine

## 2017-11-11 DIAGNOSIS — B372 Candidiasis of skin and nail: Secondary | ICD-10-CM

## 2017-11-11 NOTE — Telephone Encounter (Signed)
Last office visit 07/22/17 for anxiety.  Last refilled 03/04/2017 for 30 g with no refills.  Ok to refill?

## 2017-11-29 ENCOUNTER — Encounter: Payer: Self-pay | Admitting: Family Medicine

## 2017-11-29 ENCOUNTER — Ambulatory Visit (INDEPENDENT_AMBULATORY_CARE_PROVIDER_SITE_OTHER): Payer: Managed Care, Other (non HMO) | Admitting: Family Medicine

## 2017-11-29 VITALS — BP 138/90 | HR 77 | Temp 98.2°F | Ht 62.5 in | Wt 156.8 lb

## 2017-11-29 DIAGNOSIS — R1012 Left upper quadrant pain: Secondary | ICD-10-CM | POA: Diagnosis not present

## 2017-11-29 DIAGNOSIS — N898 Other specified noninflammatory disorders of vagina: Secondary | ICD-10-CM | POA: Diagnosis not present

## 2017-11-29 NOTE — Patient Instructions (Signed)
Good to see you today  Use an unscented, mild bar soap for genitals- wash gently and pat dry  Be sure to drink enough water to make your urine light yellow, void immediately after intercourse  I will notify you of wet prep

## 2017-11-29 NOTE — Progress Notes (Signed)
   Subjective:    Patient ID: Tina Schmidt, female    DOB: 1980-02-26, 37 y.o.   MRN: [id number]  HPI This is a 37 yo female who presents today with two concerns.   Pain under left breast last week, very achy, felt bloated. She discussed with her mother who suggested she might be constipated so she started stool softener BID with good BM and resolution of pain. Has been trying to increase vegetable intake.   Foul smelling vaginal discharge (small amount white) for a couple of weeks. A little itching around vagina and anus.   Mood significantly improved on escitalopram.   Past Medical History:  Diagnosis Date  . Depression   . GERD (gastroesophageal reflux disease)   . Hypertension    Past Surgical History:  Procedure Laterality Date  . CESAREAN SECTION    . CHOLECYSTECTOMY     Family History  Problem Relation Age of Onset  . Asthma Mother   . Hyperlipidemia Mother   . Hypertension Mother   . Hypertension Father    Social History   Tobacco Use  . Smoking status: Former Games developermoker  . Smokeless tobacco: Never Used  Substance Use Topics  . Alcohol use: Yes    Comment: occ  . Drug use: No      Review of Systems Per HPI    Objective:   Physical Exam  Constitutional: She is oriented to person, place, and time. She appears well-developed and well-nourished. No distress.  HENT:  Head: Normocephalic and atraumatic.  Cardiovascular: Normal rate.  Pulmonary/Chest: Effort normal.  Abdominal: Soft. Bowel sounds are normal. She exhibits no distension and no mass. There is no tenderness. There is no rebound and no guarding.  Genitourinary: Pelvic exam was performed with patient supine. No labial fusion. There is no rash, tenderness, lesion or injury on the right labia. There is no rash, tenderness, lesion or injury on the left labia.  Genitourinary Comments: Small amount white discharge at introitus. No noticeable odor.   Neurological: She is alert and oriented to person,  place, and time.  Skin: Skin is warm and dry. She is not diaphoretic.  Psychiatric: She has a normal mood and affect. Her behavior is normal. Judgment and thought content normal.  Vitals reviewed.     BP 138/90 (BP Location: Right Arm, Patient Position: Sitting, Cuff Size: Normal)   Pulse 77   Temp 98.2 F (36.8 C) (Oral)   Ht 5' 2.5" (1.588 m)   Wt 156 lb 12.8 oz (71.1 kg)   LMP 11/16/2017   SpO2 97%   BMI 28.22 kg/m  Wt Readings from Last 3 Encounters:  11/29/17 156 lb 12.8 oz (71.1 kg)  07/22/17 156 lb (70.8 kg)  02/22/17 146 lb 8 oz (66.5 kg)       Assessment & Plan:  1. Vaginal discharge - encouraged her to use mild soap for cleaning - WET PREP BY MOLECULAR PROBE  2. LUQ pain - resolved, likely due to constipation - encouraged increased fiber intake and treatment with Mira lax as needed - RTC precautions reviewed   Olean Reeeborah Gessner, FNP-BC  [facility]  11/29/2017 5:34 PM

## 2017-11-30 LAB — WET PREP BY MOLECULAR PROBE
Candida species: NOT DETECTED
GARDNERELLA VAGINALIS: NOT DETECTED
MICRO NUMBER:: 91378988
SPECIMEN QUALITY:: ADEQUATE
TRICHOMONAS VAG: NOT DETECTED

## 2018-01-07 ENCOUNTER — Encounter: Payer: Self-pay | Admitting: Family Medicine

## 2018-03-07 ENCOUNTER — Ambulatory Visit: Payer: 59 | Admitting: Family Medicine

## 2018-06-10 ENCOUNTER — Encounter: Payer: Self-pay | Admitting: Family Medicine

## 2018-06-10 ENCOUNTER — Other Ambulatory Visit: Payer: Self-pay | Admitting: Family Medicine

## 2018-06-10 DIAGNOSIS — G8929 Other chronic pain: Secondary | ICD-10-CM

## 2018-06-10 MED ORDER — CYCLOBENZAPRINE HCL 10 MG PO TABS: 10.0000 mg | ORAL_TABLET | Freq: Two times a day (BID) | ORAL | 0 refills | Status: DC | PRN

## 2018-06-10 NOTE — Telephone Encounter (Signed)
See refill request.

## 2018-06-10 NOTE — Telephone Encounter (Signed)
Last office visit 11/29/2017 Vaginal Discharge & LUQ pain.  Last refilled 10/11/2017 for #20 with no refills.  No future appointments.

## 2018-07-07 ENCOUNTER — Encounter: Payer: Self-pay | Admitting: Family Medicine

## 2018-07-07 NOTE — Telephone Encounter (Signed)
Last physical was on 02/22/2017. No future appointments.

## 2018-07-08 ENCOUNTER — Other Ambulatory Visit: Payer: Self-pay | Admitting: Family Medicine

## 2018-07-08 DIAGNOSIS — Z833 Family history of diabetes mellitus: Secondary | ICD-10-CM

## 2018-07-08 DIAGNOSIS — Z83438 Family history of other disorder of lipoprotein metabolism and other lipidemia: Secondary | ICD-10-CM

## 2018-07-08 DIAGNOSIS — E663 Overweight: Secondary | ICD-10-CM

## 2018-07-08 DIAGNOSIS — F419 Anxiety disorder, unspecified: Secondary | ICD-10-CM

## 2018-07-11 ENCOUNTER — Other Ambulatory Visit (INDEPENDENT_AMBULATORY_CARE_PROVIDER_SITE_OTHER): Payer: Managed Care, Other (non HMO)

## 2018-07-11 DIAGNOSIS — E663 Overweight: Secondary | ICD-10-CM

## 2018-07-11 DIAGNOSIS — F419 Anxiety disorder, unspecified: Secondary | ICD-10-CM

## 2018-07-11 DIAGNOSIS — Z83438 Family history of other disorder of lipoprotein metabolism and other lipidemia: Secondary | ICD-10-CM

## 2018-07-11 LAB — CBC WITH DIFFERENTIAL/PLATELET
Basophils Absolute: 0.1 10*3/uL (ref 0.0–0.1)
Basophils Relative: 1 % (ref 0.0–3.0)
Eosinophils Absolute: 0.1 10*3/uL (ref 0.0–0.7)
Eosinophils Relative: 1.7 % (ref 0.0–5.0)
HCT: 40.1 % (ref 36.0–46.0)
Hemoglobin: 12.8 g/dL (ref 12.0–15.0)
Lymphocytes Relative: 50.3 % — ABNORMAL HIGH (ref 12.0–46.0)
Lymphs Abs: 3.1 10*3/uL (ref 0.7–4.0)
MCHC: 32 g/dL (ref 30.0–36.0)
MCV: 86 fl (ref 78.0–100.0)
Monocytes Absolute: 0.4 10*3/uL (ref 0.1–1.0)
Monocytes Relative: 6.7 % (ref 3.0–12.0)
Neutro Abs: 2.5 10*3/uL (ref 1.4–7.7)
Neutrophils Relative %: 40.3 % — ABNORMAL LOW (ref 43.0–77.0)
Platelets: 346 10*3/uL (ref 150.0–400.0)
RBC: 4.66 Mil/uL (ref 3.87–5.11)
RDW: 13.9 % (ref 11.5–15.5)
WBC: 6.1 10*3/uL (ref 4.0–10.5)

## 2018-07-11 LAB — LIPID PANEL
Cholesterol: 269 mg/dL — ABNORMAL HIGH (ref 0–200)
HDL: 60.8 mg/dL (ref >39.00)
LDL Cholesterol: 185 mg/dL — ABNORMAL HIGH (ref 0–99)
NonHDL: 207.9
Total CHOL/HDL Ratio: 4
Triglycerides: 117 mg/dL (ref 0.0–149.0)
VLDL: 23.4 mg/dL (ref 0.0–40.0)

## 2018-07-11 LAB — BASIC METABOLIC PANEL
BUN: 9 mg/dL (ref 6–23)
CO2: 28 mEq/L (ref 19–32)
Calcium: 9.9 mg/dL (ref 8.4–10.5)
Chloride: 104 mEq/L (ref 96–112)
Creatinine, Ser: 0.9 mg/dL (ref 0.40–1.20)
GFR: 84.38 mL/min (ref >60.00)
Glucose, Bld: 93 mg/dL (ref 70–99)
Potassium: 4.2 mEq/L (ref 3.5–5.1)
Sodium: 140 mEq/L (ref 135–145)

## 2018-07-11 LAB — TSH: TSH: 0.67 u[IU]/mL (ref 0.35–4.50)

## 2018-07-11 LAB — VITAMIN D 25 HYDROXY (VIT D DEFICIENCY, FRACTURES): VITD: 21.39 ng/mL — ABNORMAL LOW (ref 30.00–100.00)

## 2018-07-11 LAB — HEMOGLOBIN A1C: Hgb A1c MFr Bld: 6.1 % (ref 4.6–6.5)

## 2018-08-24 ENCOUNTER — Other Ambulatory Visit: Payer: Self-pay | Admitting: Family Medicine

## 2018-08-24 DIAGNOSIS — F419 Anxiety disorder, unspecified: Secondary | ICD-10-CM

## 2018-08-25 ENCOUNTER — Other Ambulatory Visit: Payer: Self-pay | Admitting: Family Medicine

## 2018-08-25 ENCOUNTER — Encounter: Payer: Self-pay | Admitting: Family Medicine

## 2018-08-25 DIAGNOSIS — F419 Anxiety disorder, unspecified: Secondary | ICD-10-CM

## 2018-08-25 MED ORDER — ESCITALOPRAM OXALATE 10 MG PO TABS: 10.0000 mg | ORAL_TABLET | Freq: Every day | ORAL | 2 refills | Status: DC

## 2018-08-25 NOTE — Telephone Encounter (Signed)
Last filled 07/31/17, #30 x 2 rf Last OV 11/29/17 Acute, 07/22/17 for anxiety Pt was supposed to follow up in Sept 2019 but never scheduled.   See Mychart encounter. Pt having insurance issues

## 2018-08-25 NOTE — Telephone Encounter (Signed)
Last filled 07/31/17, #30 x 2 rf Last OV 11/29/17 Acute, 07/22/17 for anxiety Pt was supposed to follow up in Sept 2019 but never scheduled.   Please advise. Pt also requesting refill of Zoloft, see refill encounter.

## 2018-10-03 ENCOUNTER — Other Ambulatory Visit: Payer: Self-pay

## 2018-10-03 ENCOUNTER — Ambulatory Visit: Payer: BC Managed Care – PPO | Admitting: Family Medicine

## 2018-10-03 ENCOUNTER — Encounter: Payer: Self-pay | Admitting: Family Medicine

## 2018-10-03 VITALS — BP 122/78 | HR 75 | Temp 98.6°F | Ht 62.5 in | Wt 161.0 lb

## 2018-10-03 DIAGNOSIS — F5231 Female orgasmic disorder: Secondary | ICD-10-CM | POA: Diagnosis not present

## 2018-10-03 DIAGNOSIS — F419 Anxiety disorder, unspecified: Secondary | ICD-10-CM

## 2018-10-03 DIAGNOSIS — R6882 Decreased libido: Secondary | ICD-10-CM | POA: Diagnosis not present

## 2018-10-03 DIAGNOSIS — F32A Depression, unspecified: Secondary | ICD-10-CM

## 2018-10-03 DIAGNOSIS — K219 Gastro-esophageal reflux disease without esophagitis: Secondary | ICD-10-CM | POA: Diagnosis not present

## 2018-10-03 DIAGNOSIS — F329 Major depressive disorder, single episode, unspecified: Secondary | ICD-10-CM

## 2018-10-03 MED ORDER — BUPROPION HCL ER (XL) 150 MG PO TB24: 150.0000 mg | ORAL_TABLET | Freq: Every day | ORAL | 5 refills | Status: DC

## 2018-10-03 NOTE — Patient Instructions (Addendum)
Good to see you today   For dry eyes- Refresh Celluvisc  Please take half of your escitalopram until gone then start bupropion. Let me know if you have any problems. Take in the morning. Will take 4-6 weeks to get in your system.   Follow up in 3-4 months for your annual exam.   Take omeprazole for 2-4 more weeks with evening meal.   See info below, avoid triggers, continue to work on weight loss. Can also use Mylanta, Mylicon, Tums as needed.    Gastroesophageal Reflux Disease, Adult Gastroesophageal reflux (GER) happens when acid from the stomach flows up into the tube that connects the mouth and the stomach (esophagus). Normally, food travels down the esophagus and stays in the stomach to be digested. However, when a person has GER, food and stomach acid sometimes move back up into the esophagus. If this becomes a more serious problem, the person may be diagnosed with a disease called gastroesophageal reflux disease (GERD). GERD occurs when the reflux:  Happens often.  Causes frequent or severe symptoms.  Causes problems such as damage to the esophagus. When stomach acid comes in contact with the esophagus, the acid may cause soreness (inflammation) in the esophagus. Over time, GERD may create small holes (ulcers) in the lining of the esophagus. What are the causes? This condition is caused by a problem with the muscle between the esophagus and the stomach (lower esophageal sphincter, or LES). Normally, the LES muscle closes after food passes through the esophagus to the stomach. When the LES is weakened or abnormal, it does not close properly, and that allows food and stomach acid to go back up into the esophagus. The LES can be weakened by certain dietary substances, medicines, and medical conditions, including:  Tobacco use.  Pregnancy.  Having a hiatal hernia.  Alcohol use.  Certain foods and beverages, such as coffee, chocolate, onions, and peppermint. What increases the  risk? You are more likely to develop this condition if you:  Have an increased body weight.  Have a connective tissue disorder.  Use NSAID medicines. What are the signs or symptoms? Symptoms of this condition include:  Heartburn.  Difficult or painful swallowing.  The feeling of having a lump in the throat.  Abitter taste in the mouth.  Bad breath.  Having a large amount of saliva.  Having an upset or bloated stomach.  Belching.  Chest pain. Different conditions can cause chest pain. Make sure you see your health care provider if you experience chest pain.  Shortness of breath or wheezing.  Ongoing (chronic) cough or a night-time cough.  Wearing away of tooth enamel.  Weight loss. How is this diagnosed? Your health care provider will take a medical history and perform a physical exam. To determine if you have mild or severe GERD, your health care provider may also monitor how you respond to treatment. You may also have tests, including:  A test to examine your stomach and esophagus with a small camera (endoscopy).  A test thatmeasures the acidity level in your esophagus.  A test thatmeasures how much pressure is on your esophagus.  A barium swallow or modified barium swallow test to show the shape, size, and functioning of your esophagus. How is this treated? The goal of treatment is to help relieve your symptoms and to prevent complications. Treatment for this condition may vary depending on how severe your symptoms are. Your health care provider may recommend:  Changes to your diet.  Medicine.  Surgery. Follow these instructions at home: Eating and drinking   Follow a diet as recommended by your health care provider. This may involve avoiding foods and drinks such as: ? Coffee and tea (with or without caffeine). ? Drinks that containalcohol. ? Energy drinks and sports drinks. ? Carbonated drinks or sodas. ? Chocolate and cocoa. ? Peppermint and  mint flavorings. ? Garlic and onions. ? Horseradish. ? Spicy and acidic foods, including peppers, chili powder, curry powder, vinegar, hot sauces, and barbecue sauce. ? Citrus fruit juices and citrus fruits, such as oranges, lemons, and limes. ? Tomato-based foods, such as red sauce, chili, salsa, and pizza with red sauce. ? Fried and fatty foods, such as donuts, french fries, potato chips, and high-fat dressings. ? High-fat meats, such as hot dogs and fatty cuts of red and white meats, such as rib eye steak, sausage, ham, and bacon. ? High-fat dairy items, such as whole milk, butter, and cream cheese.  Eat small, frequent meals instead of large meals.  Avoid drinking large amounts of liquid with your meals.  Avoid eating meals during the 2-3 hours before bedtime.  Avoid lying down right after you eat.  Do not exercise right after you eat. Lifestyle   Do not use any products that contain nicotine or tobacco, such as cigarettes, e-cigarettes, and chewing tobacco. If you need help quitting, ask your health care provider.  Try to reduce your stress by using methods such as yoga or meditation. If you need help reducing stress, ask your health care provider.  If you are overweight, reduce your weight to an amount that is healthy for you. Ask your health care provider for guidance about a safe weight loss goal. General instructions  Pay attention to any changes in your symptoms.  Take over-the-counter and prescription medicines only as told by your health care provider. Do not take aspirin, ibuprofen, or other NSAIDs unless your health care provider told you to do so.  Wear loose-fitting clothing. Do not wear anything tight around your waist that causes pressure on your abdomen.  Raise (elevate) the head of your bed about 6 inches (15 cm).  Avoid bending over if this makes your symptoms worse.  Keep all follow-up visits as told by your health care provider. This is important.  Contact a health care provider if:  You have: ? New symptoms. ? Unexplained weight loss. ? Difficulty swallowing or it hurts to swallow. ? Wheezing or a persistent cough. ? A hoarse voice.  Your symptoms do not improve with treatment. Get help right away if you:  Have pain in your arms, neck, jaw, teeth, or back.  Feel sweaty, dizzy, or light-headed.  Have chest pain or shortness of breath.  Vomit and your vomit looks like blood or coffee grounds.  Faint.  Have stool that is bloody or black.  Cannot swallow, drink, or eat. Summary  Gastroesophageal reflux happens when acid from the stomach flows up into the esophagus. GERD is a disease in which the reflux happens often, causes frequent or severe symptoms, or causes problems such as damage to the esophagus.  Treatment for this condition may vary depending on how severe your symptoms are. Your health care provider may recommend diet and lifestyle changes, medicine, or surgery.  Contact a health care provider if you have new or worsening symptoms.  Take over-the-counter and prescription medicines only as told by your health care provider. Do not take aspirin, ibuprofen, or other NSAIDs unless your health care provider told  you to do so.  Keep all follow-up visits as told by your health care provider. This is important. This information is not intended to replace advice given to you by your health care provider. Make sure you discuss any questions you have with your health care provider. Document Released: 10/11/2004 Document Revised: 07/10/2017 Document Reviewed: 07/10/2017 Elsevier Patient Education  2020 [employer].

## 2018-10-03 NOTE — Progress Notes (Signed)
Subjective:    Patient ID: Tina Schmidt, female    DOB: Dec 29, 1980, 38 y.o.   MRN: [id number]  HPI Chief Complaint  Patient presents with  . Libido    decreased Libido x 1 month. Has tried lexapro but caused dry eyes, still taking this, uses eye drops. Pt states that she has a little vaginal dryness and decreased drive.  . Vaginal Bleeding    spotting randoming since last birth of son - requesting a referral to GYN  . Gastroesophageal Reflux    x 3 months+, worse laying down at night, cannot even drink water prior to going to bed. Has been taking Omerprazole OTC x 2 weeks (inconsistently) and it seems to help some.    This is a 38 yo female who presents today with the above concerns.    Depression/anxiety- decreased libido and onorgasmia. Has noted for last 3 weeks. Has been working out. Has also noticed dry eyes. Symptoms seem to correlate to taking escitalopram more regularly.   Vaginal spotting- periods regular until last month, ? Due to taking lexapro regularly. Small amounts of spotting some months off and on x several years. Has had tubal ligation, no need for contraception.  GERD- over last couple of months, has been drinking more ETOH and eating more . Has started backing off on ETOH and portion sizes. Has burning in esophagus, taking tums and omeprazole. Omeprazole x 2 weeks most days with improvement. Is able to identify triggers- spicy, tomato.   Past Medical History:  Diagnosis Date  . Depression   . GERD (gastroesophageal reflux disease)   . Hypertension    Past Surgical History:  Procedure Laterality Date  . CESAREAN SECTION    . CHOLECYSTECTOMY     Family History  Problem Relation Age of Onset  . Asthma Mother   . Hyperlipidemia Mother   . Hypertension Mother   . Hypertension Father    Social History   Tobacco Use  . Smoking status: Former Research scientist (life sciences)  . Smokeless tobacco: Never Used  Substance Use Topics  . Alcohol use: Yes    Comment: occ  . Drug use:  No     Review of Systems Per hpi    Objective:   Physical Exam Vitals signs reviewed.  Constitutional:      General: She is not in acute distress.    Appearance: She is normal weight. She is not ill-appearing, toxic-appearing or diaphoretic.  HENT:     Head: Normocephalic and atraumatic.  Eyes:     Conjunctiva/sclera: Conjunctivae normal.  Cardiovascular:     Rate and Rhythm: Normal rate.  Neurological:     Mental Status: She is alert and oriented to person, place, and time.       BP 122/78 (BP Location: Left Arm, Patient Position: Sitting, Cuff Size: Normal)   Pulse 75   Temp 98.6 F (37 C) (Temporal)   Ht 5' 2.5" (1.588 m)   Wt 161 lb (73 kg)   SpO2 96%   BMI 28.98 kg/m  Wt Readings from Last 3 Encounters:  10/03/18 161 lb (73 kg)  11/29/17 156 lb 12.8 oz (71.1 kg)  07/22/17 156 lb (70.8 kg)       Assessment & Plan:  1. Anxiety and depression -Will change her to bupropion from escitalopram.  Discussed possible l side effects, expectations of therapy. - buPROPion (WELLBUTRIN XL) 150 MG 24 hr tablet; Take 1 tablet (150 mg total) by mouth daily.  Dispense: 30 tablet; Refill: 5 -Follow-up  in 8 to 12 weeks  2. Decreased libido -Suspect related to SSRI, should improve off medication  3. Anorgasmia of female -See #2  4. Gastroesophageal reflux disease, esophagitis presence not specified -Provided information about diagnosis, verbal and written -Encouraged her to avoid triggers, avoid eating close to bedtime, she can elevate her bed and continue omeprazole for 4 weeks and then reduce   Olean Reeeborah Gessner, FNP-BC  [facility], [facility]  10/04/2018 7:27 AM

## 2018-10-28 ENCOUNTER — Encounter: Payer: Self-pay | Admitting: Family Medicine

## 2018-10-29 ENCOUNTER — Encounter: Payer: Self-pay | Admitting: Family Medicine

## 2018-10-29 ENCOUNTER — Ambulatory Visit: Payer: BC Managed Care – PPO | Admitting: Family Medicine

## 2018-10-29 ENCOUNTER — Other Ambulatory Visit: Payer: Self-pay

## 2018-10-29 VITALS — BP 130/80 | HR 80 | Temp 98.7°F | Ht 62.5 in | Wt 163.0 lb

## 2018-10-29 DIAGNOSIS — S76211A Strain of adductor muscle, fascia and tendon of right thigh, initial encounter: Secondary | ICD-10-CM | POA: Diagnosis not present

## 2018-10-29 MED ORDER — IBUPROFEN 800 MG PO TABS: 800.0000 mg | ORAL_TABLET | Freq: Three times a day (TID) | ORAL | 0 refills | Status: DC | PRN

## 2018-10-29 NOTE — Patient Instructions (Signed)
I suspect you have a severe muscle strain in your groin I have sent in a prescription for high dose ibuprofen, take every 8 hours for 3-4 days then every 8 hours as needed If not improved in 5-7 days or if pain gets worse, please be in touch Adductor Muscle Strain  An adductor muscle strain, also called a groin strain or pull, is an injury to the muscles or tendons on the upper, inner part of the thigh. These muscles are called the adductor muscles or groin muscles. They are responsible for moving the legs across the body or pulling the legs together. A muscle strain occurs when a muscle is overstretched and some muscle fibers are torn. An adductor muscle strain can range from mild to severe, depending on how many muscle fibers are affected and whether the muscle fibers are partially or completely torn. What are the causes? Adductor muscle strains usually occur during exercise or while participating in sports. The injury often happens when a sudden, violent force is placed on a muscle, stretching the muscle too far. A strain is more likely to happen when your muscles are not warmed up or if you are not properly conditioned. This injury may be caused by:  Stretching the adductor muscles too far or too suddenly, often during side-to-side motion with a sudden change in direction.  Putting repeated stress on the adductor muscles over a long period of time.  Performing vigorous activity without properly stretching the adductor muscles beforehand. What are the signs or symptoms? Symptoms of this condition include:  Pain and tenderness in the groin area. This begins as sharp pain and persists as a dull ache.  A popping or snapping feeling when the injury occurs (for severe strains).  Swelling or bruising.  Muscle spasms.  Weakness in the leg.  Stiffness in the groin area with decreased ability to move the affected muscles. How is this diagnosed? This condition may be diagnosed based on:   Your symptoms and a description of how the injury occurred.  A physical exam.  Imaging tests, such as: ? X-rays. These are sometimes needed to rule out a broken bone or cartilage problems. ? An ultrasound, CT scan, or MRI. These may be done if your health care provider suspects a complete muscle tear or needs to check for other injuries. How is this treated? An adductor strain will often heal on its own. If needed, this condition may be treated with:  PRICE therapy. PRICE stands for protection of the injured area, rest, ice, pressure (compression), and elevation.  Medicines to help manage pain and swelling (anti-inflammatory medicines).  Crutches. You may be directed to use these for the first few days to minimize your pain. Depending on the severity of the muscle strain, recovery time may vary from a few weeks to several months. Severe injuries often require 4-6 weeks for recovery. In those cases, complete healing can take 4-5 months. Follow these instructions at home: PRICE Therapy   Protect the muscle from being injured again.  Rest. Do not use the strained muscle if it causes pain.  If directed, put ice on the injured area: ? Put ice in a plastic bag. ? Place a towel between your skin and the bag. ? Leave the ice on for 20 minutes, 2-3 times a day. Do this for the first 2 days after the injury.  Apply compression by wrapping the injured area with an elastic bandage as told by your health care provider.  Raise (elevate) the  injured area above the level of your heart while you are sitting or lying down. General instructions  Take over-the-counter and prescription medicines only as told by your health care provider.  Walk, stretch, and do exercises as told by your health care provider. Only do these activities if you can do so without any pain.  Follow your treatment plan as told by your health care provider. This may include: ? Physical therapy. ? Massage. ? Local  electrical stimulation (transcutaneous electrical nerve stimulation, TENS). How is this prevented?  Warm up and stretch before being active.  Cool down and stretch after being active.  Give your body time to rest between periods of activity.  Make sure to use equipment that fits you.  Be safe and responsible while being active to avoid slips and falls.  Maintain physical fitness, including: ? Proper conditioning in the adductor muscles. ? Overall strength, flexibility, and endurance. Contact a health care provider if:  You have increased pain or swelling in the affected area.  Your symptoms are not improving or they are getting worse. Summary  An adductor muscle strain, also called a groin strain or pull, is an injury to the muscles or tendons on the upper, inner part of the thigh.  A muscle strain occurs when a muscle is overstretched and some muscle fibers are torn.  Depending on the severity of the muscle strain, recovery time may vary from a few weeks to several months. This information is not intended to replace advice given to you by your health care provider. Make sure you discuss any questions you have with your health care provider. Document Released: 08/30/2003 Document Revised: 04/22/2018 Document Reviewed: 06/03/2017 Elsevier Patient Education  2020 [employer].

## 2018-10-29 NOTE — Progress Notes (Signed)
Subjective:    Patient ID: Tina Schmidt, female    DOB: 14-Apr-1980, 38 y.o.   MRN: [id number]  HPI Chief Complaint  Patient presents with  . Groin Pain    Pt states that she attends a Burn boot camp and was working out on Thursday night 10/23/2018 and pain set in on Saturday, worsening the last 4 days. Tenderness in groin. pt has been using Ice and Ibuprofen.     This is a 38 yo female who presents today with above cc. Work out was intense for leg exercises, some of which she had not done before. While sitting, pain dull and achy. With movement, pain is sharp. Ibuprofen 600 mg with minimal improvement of pain. Flexeril did not help. Improved some with rest. No radiation, numbness or tingling. No change in bowel or bladder habits.   Anxiety and depression- last month was changed from escitalopram to bupropion. She reports that her libido has improved and she is pleased with how she is doing.   Past Medical History:  Diagnosis Date  . Depression   . Dry eyes, bilateral   . GERD (gastroesophageal reflux disease)   . Hypertension    Past Surgical History:  Procedure Laterality Date  . CESAREAN SECTION    . CHOLECYSTECTOMY    . REFRACTIVE SURGERY     Family History  Problem Relation Age of Onset  . Asthma Mother   . Hyperlipidemia Mother   . Hypertension Mother   . Hypertension Father    Social History   Tobacco Use  . Smoking status: Former Research scientist (life sciences)  . Smokeless tobacco: Never Used  Substance Use Topics  . Alcohol use: Yes    Comment: occ  . Drug use: No    Review of Systems    per HPI Objective:   Physical Exam Vitals signs reviewed.  Constitutional:      General: She is not in acute distress.    Appearance: Normal appearance. She is normal weight. She is not ill-appearing, toxic-appearing or diaphoretic.  HENT:     Head: Normocephalic and atraumatic.  Eyes:     Conjunctiva/sclera: Conjunctivae normal.  Cardiovascular:     Rate and Rhythm: Normal rate.   Pulmonary:     Effort: Pulmonary effort is normal.  Musculoskeletal:     Comments: Gait disturbance, trying not to bear weight on left leg. Pain with left hip ROM. No tenderness over gluteus. No abnormality palpated or visualized in standing, sitting or supine position. Normal strength.   Neurological:     Mental Status: She is alert and oriented to person, place, and time.  Psychiatric:        Mood and Affect: Mood normal.        Behavior: Behavior normal.        Thought Content: Thought content normal.        Judgment: Judgment normal.       BP 130/80 (BP Location: Left Arm, Patient Position: Sitting, Cuff Size: Normal)   Pulse 80   Temp 98.7 F (37.1 C) (Oral)   Ht 5' 2.5" (1.588 m)   Wt 163 lb (73.9 kg)   SpO2 98%   BMI 29.34 kg/m  Wt Readings from Last 3 Encounters:  10/29/18 163 lb (73.9 kg)  10/03/18 161 lb (73 kg)  11/29/17 156 lb 12.8 oz (71.1 kg)       Assessment & Plan:  1. Strain of groin, right, initial encounter - discussed with Dr. Lorelei Pont - gently increase activity as  tolerated - RTC precautions reviewed - ibuprofen (ADVIL) 800 MG tablet; Take 1 tablet (800 mg total) by mouth every 8 (eight) hours as needed.  Dispense: 30 tablet; Refill: 0 - DME Crutches   Olean Ree, FNP-BC  [facility], [facility]  10/31/2018 8:06 AM

## 2018-10-31 ENCOUNTER — Encounter: Payer: Self-pay | Admitting: Family Medicine

## 2018-10-31 ENCOUNTER — Other Ambulatory Visit: Payer: Self-pay | Admitting: Family Medicine

## 2018-10-31 MED ORDER — NITROFURANTOIN MONOHYD MACRO 100 MG PO CAPS: 100.0000 mg | ORAL_CAPSULE | Freq: Two times a day (BID) | ORAL | 0 refills | Status: AC

## 2018-11-05 ENCOUNTER — Other Ambulatory Visit: Payer: Self-pay | Admitting: Family Medicine

## 2018-11-05 DIAGNOSIS — S76211A Strain of adductor muscle, fascia and tendon of right thigh, initial encounter: Secondary | ICD-10-CM

## 2018-11-06 NOTE — Telephone Encounter (Signed)
This RX was started on 10/29/2018 #30 with 0 refill.

## 2018-11-13 ENCOUNTER — Other Ambulatory Visit: Payer: Self-pay | Admitting: Family Medicine

## 2018-11-13 DIAGNOSIS — S76211A Strain of adductor muscle, fascia and tendon of right thigh, initial encounter: Secondary | ICD-10-CM

## 2018-11-17 NOTE — Telephone Encounter (Signed)
Last office visit 10/29/2018 for strain of right groin.  Last refilled 11/06/2018 for #30 with no refills.  No future appointments.  Ok to refill?

## 2018-11-28 ENCOUNTER — Encounter: Payer: Self-pay | Admitting: Family Medicine

## 2018-12-05 ENCOUNTER — Other Ambulatory Visit: Payer: Self-pay

## 2018-12-05 ENCOUNTER — Encounter: Payer: Self-pay | Admitting: Family Medicine

## 2018-12-05 ENCOUNTER — Ambulatory Visit: Payer: BC Managed Care – PPO | Admitting: Family Medicine

## 2018-12-05 VITALS — BP 122/60 | HR 82 | Temp 98.4°F | Ht 62.5 in | Wt 160.0 lb

## 2018-12-05 DIAGNOSIS — N941 Unspecified dyspareunia: Secondary | ICD-10-CM

## 2018-12-05 DIAGNOSIS — R1031 Right lower quadrant pain: Secondary | ICD-10-CM | POA: Diagnosis not present

## 2018-12-05 NOTE — Progress Notes (Signed)
Acute Office Visit  Subjective:    Patient ID: Tina Schmidt, female    DOB: June 25, 1980, 38 y.o.   MRN: [id number]  Chief Complaint  Patient presents with  . Abdominal Pain    Worsening right lower abd pain x 6 months. Denies N/V/D or constipation, no noted fever. Pain/pressure radiates to rectum.    HPI Patient is 38 yo [place] American female in today for acute visit. Pt reports she started having right abdominal pain about 6 months ago. For the past time intermittent 5-10 minutes , 5 to 6 times a day. Pt describes pain as "rubber band" that connects  RLQ and rectum. Pt takes stool softener everyday and has regular BM now.  Pt also experiences pain during intercourse.   Past Medical History:  Diagnosis Date  . Depression   . Dry eyes, bilateral   . GERD (gastroesophageal reflux disease)   . Hypertension     Past Surgical History:  Procedure Laterality Date  . CESAREAN SECTION    . CHOLECYSTECTOMY    . REFRACTIVE SURGERY      Family History  Problem Relation Age of Onset  . Asthma Mother   . Hyperlipidemia Mother   . Hypertension Mother   . Hypertension Father     Social History   Socioeconomic History  . Marital status: Married    Spouse name: Not on file  . Number of children: Not on file  . Years of education: Not on file  . Highest education level: Not on file  Occupational History  . Not on file  Social Needs  . Financial resource strain: Not on file  . Food insecurity    Worry: Not on file    Inability: Not on file  . Transportation needs    Medical: Not on file    Non-medical: Not on file  Tobacco Use  . Smoking status: Former Research scientist (life sciences)  . Smokeless tobacco: Never Used  Substance and Sexual Activity  . Alcohol use: Yes    Comment: occ  . Drug use: No  . Sexual activity: Yes    Partners: Male    Birth control/protection: None  Lifestyle  . Physical activity    Days per week: Not on file    Minutes per session: Not on file  . Stress: Not on  file  Relationships  . Social Herbalist on phone: Not on file    Gets together: Not on file    Attends religious service: Not on file    Active member of club or organization: Not on file    Attends meetings of clubs or organizations: Not on file    Relationship status: Not on file  . Intimate partner violence    Fear of current or ex partner: Not on file    Emotionally abused: Not on file    Physically abused: Not on file    Forced sexual activity: Not on file  Other Topics Concern  . Not on file  Social History Narrative   Patient is married and has 3 sons. She is a Camera operator at [employer] She enjoys dancing, games and music.     Outpatient Medications Prior to Visit  Medication Sig Dispense Refill  . buPROPion (WELLBUTRIN XL) 150 MG 24 hr tablet Take 1 tablet (150 mg total) by mouth daily. 30 tablet 5  . clobetasol (TEMOVATE) 0.05 % external solution Apply to scalp daily prn scale and rash.  May mix with other oils.    Marland Kitchen  cyclobenzaprine (FLEXERIL) 10 MG tablet Take 1 tablet (10 mg total) by mouth 2 (two) times daily as needed for muscle spasms. 20 tablet 0  . ibuprofen (ADVIL) 800 MG tablet TAKE 1 TABLET(800 MG) BY MOUTH EVERY 8 HOURS AS NEEDED 30 tablet 0  . ketoconazole (NIZORAL) 2 % shampoo     . nystatin cream (MYCOSTATIN) APPLY EXTERNALLY TO THE AFFECTED AREA TWICE DAILY 30 g 1   No facility-administered medications prior to visit.     No Known Allergies  Review of Systems  Respiratory: Negative for cough, shortness of breath and wheezing.   Cardiovascular: Negative for chest pain, palpitations and leg swelling.  Gastrointestinal: Positive for abdominal pain. Negative for blood in stool, constipation, diarrhea, nausea and vomiting.  Genitourinary: Negative for dysuria, frequency, hematuria and urgency.  Musculoskeletal: Negative.        Objective:    Physical Exam  Constitutional: She is oriented to person, place, and time. She appears  well-developed and well-nourished.  Cardiovascular: Normal rate, regular rhythm and normal heart sounds. Exam reveals no friction rub.  No murmur heard. Pulmonary/Chest: Effort normal and breath sounds normal. No respiratory distress. She has no wheezes. She has no rales. She exhibits no tenderness.  Abdominal: Soft. Bowel sounds are normal. She exhibits no distension. There is abdominal tenderness.  Musculoskeletal: Normal range of motion.  Neurological: She is alert and oriented to person, place, and time.  Skin: Skin is warm and dry.    BP 122/60 (BP Location: Left Arm, Patient Position: Sitting, Cuff Size: Normal)   Pulse 82   Temp 98.4 F (36.9 C) (Temporal)   Ht 5' 2.5" (1.588 m)   Wt 72.6 kg   SpO2 95%   BMI 28.80 kg/m  Wt Readings from Last 3 Encounters:  12/05/18 72.6 kg  10/29/18 73.9 kg  10/03/18 73 kg    Health Maintenance Due  Topic Date Due  . HIV Screening  08/15/1994  . TETANUS/TDAP  08/15/1998    There are no preventive care reminders to display for this patient.   Lab Results  Component Value Date   TSH 0.67 07/11/2018   Lab Results  Component Value Date   WBC 6.1 07/11/2018   HGB 12.8 07/11/2018   HCT 40.1 07/11/2018   MCV 86.0 07/11/2018   PLT 346.0 07/11/2018   Lab Results  Component Value Date   NA 140 07/11/2018   K 4.2 07/11/2018   CO2 28 07/11/2018   GLUCOSE 93 07/11/2018   BUN 9 07/11/2018   CREATININE 0.90 07/11/2018   BILITOT 0.3 12/28/2016   AST 15 12/28/2016   ALT 10 12/28/2016   PROT 7.5 12/28/2016   CALCIUM 9.9 07/11/2018   GFR 84.38 07/11/2018   Lab Results  Component Value Date   CHOL 269 (H) 07/11/2018   Lab Results  Component Value Date   HDL 60.80 07/11/2018   Lab Results  Component Value Date   LDLCALC 185 (H) 07/11/2018   Lab Results  Component Value Date   TRIG 117.0 07/11/2018   Lab Results  Component Value Date   CHOLHDL 4 07/11/2018   Lab Results  Component Value Date   HGBA1C 6.1 07/11/2018        Assessment & Plan:  1. Right lower quadrant pain - US PELVIC COMPLETE WITH TRANSVAGINAL; Future  2. Dyspareunia in female US PELVIC COMPLETE WITH TRANSVAGINAL; Future   Problem List Items Addressed This Visit    None       No orders  of the defined types were placed in this encounter.    Valentina Gu, RN

## 2018-12-05 NOTE — Progress Notes (Signed)
Subjective:    Patient ID: Tina Schmidt, female    DOB: Jun 14, 1980, 38 y.o.   MRN: [id number]  HPI Chief Complaint  Patient presents with  . Abdominal Pain    Worsening right lower abd pain x 6 months. Denies N/V/D or constipation, no noted fever. Pain/pressure radiates to rectum.   This is a 38 yo female who presents today with above cc. She is having intermittent, sharp pain, lasting 5-10 minutes, 5-6 times a day. Pain is from right lower quadrant to rectum. Regular bowel movements with daily stool softener. Has had issues with constipation in past. Good water intake. Has increased fiber/vegetables. Intermittent pain with vaginal intercourse. Feels pain behind rectum. No anal intercourse. She is concerned for fibroids, her mother has and reports similar symptoms. Periods regular, first 2 days heavy then spotting only for remaining 3 days. A little lighter than they used to be.   Past Medical History:  Diagnosis Date  . Depression   . Dry eyes, bilateral   . GERD (gastroesophageal reflux disease)   . Hypertension    Past Surgical History:  Procedure Laterality Date  . CESAREAN SECTION    . CHOLECYSTECTOMY    . REFRACTIVE SURGERY     Family History  Problem Relation Age of Onset  . Asthma Mother   . Hyperlipidemia Mother   . Hypertension Mother   . Hypertension Father    Social History   Tobacco Use  . Smoking status: Former Games developer  . Smokeless tobacco: Never Used  Substance Use Topics  . Alcohol use: Yes    Comment: occ  . Drug use: No      Review of Systems Per HPI    Objective:   Physical Exam Vitals signs reviewed.  Constitutional:      General: She is not in acute distress.    Appearance: Normal appearance. She is well-developed and normal weight. She is not ill-appearing, toxic-appearing or diaphoretic.  HENT:     Head: Normocephalic and atraumatic.  Eyes:     Conjunctiva/sclera: Conjunctivae normal.  Cardiovascular:     Rate and Rhythm: Normal  rate.  Pulmonary:     Effort: Pulmonary effort is normal.  Abdominal:     General: Abdomen is flat. Bowel sounds are normal. There is no distension.     Palpations: Abdomen is soft. There is no mass.     Tenderness: There is abdominal tenderness (mild right lower quadrant tenderness). There is no guarding or rebound.     Hernia: No hernia is present.  Skin:    General: Skin is warm and dry.  Neurological:     Mental Status: She is alert and oriented to person, place, and time.  Psychiatric:        Mood and Affect: Mood normal.        Behavior: Behavior normal.        Thought Content: Thought content normal.        Judgment: Judgment normal.       BP 122/60 (BP Location: Left Arm, Patient Position: Sitting, Cuff Size: Normal)   Pulse 82   Temp 98.4 F (36.9 C) (Temporal)   Ht 5' 2.5" (1.588 m)   Wt 160 lb (72.6 kg)   SpO2 95%   BMI 28.80 kg/m  Wt Readings from Last 3 Encounters:  12/05/18 160 lb (72.6 kg)  10/29/18 163 lb (73.9 kg)  10/03/18 161 lb (73 kg)       Assessment & Plan:  1. Right lower  quadrant pain - she was instructed to take Mira lax to see if symptoms related to constipation, continue good fluid intake - will check [place]  - RTC precautions reviewed - US PELVIC COMPLETE WITH TRANSVAGINAL; Future  2. Dyspareunia in female - US PELVIC COMPLETE WITH TRANSVAGINAL; Future   Clarene Reamer, FNP-BC  [facility] at [facility], [facility]  12/05/2018 12:03 PM

## 2018-12-05 NOTE — Patient Instructions (Signed)
Good to see you today  I have put in an order for a pelvic ultrasound, you should get a call about scheduling in the next couple of days  Try to take Mira lax nightly for 5-7 nights to see if symptoms improve and if BMs better, can then take as needed  Please let me know if you develop any worsening or new symptoms- fever, chills, nausea/vomiting

## 2018-12-19 ENCOUNTER — Ambulatory Visit
Admission: RE | Admit: 2018-12-19 | Discharge: 2018-12-19 | Disposition: A | Discharge status: Home / Self Care | Payer: BC Managed Care – PPO | Source: Ambulatory Visit | Attending: Family Medicine | Admitting: Family Medicine

## 2018-12-19 DIAGNOSIS — R1031 Right lower quadrant pain: Secondary | ICD-10-CM

## 2018-12-19 DIAGNOSIS — N941 Unspecified dyspareunia: Secondary | ICD-10-CM

## 2018-12-21 ENCOUNTER — Encounter: Payer: Self-pay | Admitting: Family Medicine

## 2018-12-23 ENCOUNTER — Encounter: Payer: Self-pay | Admitting: Gastroenterology

## 2018-12-23 ENCOUNTER — Other Ambulatory Visit: Payer: Self-pay | Admitting: Family Medicine

## 2018-12-23 DIAGNOSIS — K6289 Other specified diseases of anus and rectum: Secondary | ICD-10-CM

## 2018-12-23 DIAGNOSIS — N941 Unspecified dyspareunia: Secondary | ICD-10-CM

## 2019-01-07 ENCOUNTER — Other Ambulatory Visit: Payer: Self-pay | Admitting: *Deleted

## 2019-01-07 DIAGNOSIS — S76211A Strain of adductor muscle, fascia and tendon of right thigh, initial encounter: Secondary | ICD-10-CM

## 2019-01-07 MED ORDER — IBUPROFEN 800 MG PO TABS: ORAL_TABLET | ORAL | 0 refills | Status: DC

## 2019-01-07 NOTE — Telephone Encounter (Signed)
Last office visit 12/05/2018 for RLQ pain.  Last refilled 11/17/2018 for #30 with no refills.  No future appointments with PCP.  Ok to refill?

## 2019-01-26 ENCOUNTER — Encounter: Payer: Self-pay | Admitting: Gastroenterology

## 2019-01-26 ENCOUNTER — Ambulatory Visit: Payer: BC Managed Care – PPO | Admitting: Gastroenterology

## 2019-01-26 VITALS — BP 110/64 | HR 99 | Temp 98.4°F | Ht 62.0 in | Wt 158.0 lb

## 2019-01-26 DIAGNOSIS — K6289 Other specified diseases of anus and rectum: Secondary | ICD-10-CM

## 2019-01-26 DIAGNOSIS — R142 Eructation: Secondary | ICD-10-CM

## 2019-01-26 DIAGNOSIS — R1030 Lower abdominal pain, unspecified: Secondary | ICD-10-CM

## 2019-01-26 DIAGNOSIS — Z8371 Family history of colonic polyps: Secondary | ICD-10-CM | POA: Diagnosis not present

## 2019-01-26 DIAGNOSIS — Z01818 Encounter for other preprocedural examination: Secondary | ICD-10-CM

## 2019-01-26 MED ORDER — FAMOTIDINE 20 MG PO TABS: 20.0000 mg | ORAL_TABLET | Freq: Two times a day (BID) | ORAL | 3 refills | Status: DC

## 2019-01-26 MED ORDER — SUPREP BOWEL PREP KIT 17.5-3.13-1.6 GM/177ML PO SOLN: 1.0000 | ORAL | 0 refills | Status: DC

## 2019-01-26 NOTE — Progress Notes (Addendum)
Referring Provider: Emi Belfast, FNP Primary Care Physician:  Emi Belfast, FNP  Reason for Consultation: Rectal pain   IMPRESSION:  Lower abdominal pain Rectal pain without bleeding Eructation with reflux Mother with colon polyps  Etiology of lower abdominal and rectal pain is unclear. Colonoscopy recommended to exclude organic GI disease.    PLAN: Colonoscopy Trial of famotidine 20 mg BID Consider abd/pelvic CT if endoscopic is nondiagnostic  Please see the "Patient Instructions" section for addition details about the plan.  HPI: Tina Schmidt is a 39 y.o. female referred by NP Leone Payor for further evaluation of abdominal and rectal pain. 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 Previously worked at [employer].  History of cholecystectomy for symptomatic cholelithiasis in 2014, hypertension, hypercholesterolemia, and anxiety.   Lower right pelvic pain and rectal pressure.  Feels like a rubber band is pulling. Present for 6 months but worse over the last month. Not associated with menses.  Pain most recently started last night. Worsened by vaginal intercourse. No associated triggers.   Severe anorectal pain lasting 20 minutes. Recently lasting more than 30 minutes when it occurs. Not consistently related to defecation. Stools are soft. No anorectal pain between episodes.  Possible tenesmus. No rectal bleeding. Pain is not precipitated by stress, defecation, emotional stress, menstruation, or car rides. Can be precipitated by standing up, sex.   Pt takes stool softener everyday and has regular BM now. Some sense of incomplete evacuation. Tried magnesium citrate last week without a significant change in her symptoms. No change in diet.   Intermittent rectal itching. Primarily occurs in the even. Random. Occurs when she sits on the toilet. Separate from the rectal pain.  No use of topical medications.   No seepage of stool. No changes  in diet recently.  Tried ibuprofen 800 mg daily to treat a sensation of a "gas bubble."   Frequently has nighttime symptoms with the sensation of a gas bubble. Previously on Zantac. Since that came off the market, she is using Tums with incomplete relief. Increased eructation.   Transabdominal and transvaginal pelvic ultrasound 12/19/2018 showed a small amount of nonspecific endometrial fluid.  No other findings. No abdominal imaging.  No prior endoscopic evaluation.  Mother with IBS. Mother with colon polyps at age 23 requiring surveillance. No known family history of colon cancer or polyps. No family history of uterine/endometrial cancer, pancreatic cancer or gastric/stomach cancer.   Past Medical History:  Diagnosis Date  . Anxiety   . Depression   . Dry eyes, bilateral   . Gallstones   . GERD (gastroesophageal reflux disease)   . Hypercholesterolemia   . Hypertension     Past Surgical History:  Procedure Laterality Date  . CESAREAN SECTION     07/2004, 11/2007, 08/2012  . CHOLECYSTECTOMY    . REFRACTIVE SURGERY Bilateral   . TUBAL LIGATION  08/26/2012    Current Outpatient Medications  Medication Sig Dispense Refill  . buPROPion (WELLBUTRIN XL) 150 MG 24 hr tablet Take 1 tablet (150 mg total) by mouth daily. 30 tablet 5  . clobetasol (TEMOVATE) 0.05 % external solution Apply to scalp daily prn scale and rash.  May mix with other oils.    . cyclobenzaprine (FLEXERIL) 10 MG tablet Take 1 tablet (10 mg total) by mouth 2 (two) times daily as needed for muscle spasms. 20 tablet 0  . ibuprofen (ADVIL) 800 MG tablet TAKE 1 TABLET(800 MG) BY MOUTH EVERY 8 HOURS AS  NEEDED 30 tablet 0  . ketoconazole (NIZORAL) 2 % shampoo     . nystatin cream (MYCOSTATIN) APPLY EXTERNALLY TO THE AFFECTED AREA TWICE DAILY 30 g 1   No current facility-administered medications for this visit.    Allergies as of 01/26/2019  . (No Known Allergies)    Family History  Problem Relation Age of Onset   . Asthma Mother   . Hyperlipidemia Mother   . Hypertension Mother   . Irritable bowel syndrome Mother   . Hypertension Father   . Liver cancer Maternal Grandmother   . Diabetes Paternal Grandmother   . Colon cancer Neg Hx   . Esophageal cancer Neg Hx   . Rectal cancer Neg Hx     Social History   Socioeconomic History  . Marital status: Married    Spouse name: Not on file  . Number of children: Not on file  . Years of education: Not on file  . Highest education level: Not on file  Occupational History  . Not on file  Tobacco Use  . Smoking status: Former Research scientist (life sciences)  . Smokeless tobacco: Never Used  Substance and Sexual Activity  . Alcohol use: Yes    Comment: occ  . Drug use: No  . Sexual activity: Yes    Partners: Male    Birth control/protection: Surgical  Other Topics Concern  . Not on file  Social History Narrative   Patient is married and has 3 sons. She is a Camera operator at [employer] She enjoys dancing, games and music.    Social Determinants of Health   Financial Resource Strain:   . Difficulty of Paying Living Expenses: Not on file  Food Insecurity:   . Worried About Charity fundraiser in the Last Year: Not on file  . Ran Out of Food in the Last Year: Not on file  Transportation Needs:   . Lack of Transportation (Medical): Not on file  . Lack of Transportation (Non-Medical): Not on file  Physical Activity:   . Days of Exercise per Week: Not on file  . Minutes of Exercise per Session: Not on file  Stress:   . Feeling of Stress : Not on file  Social Connections:   . Frequency of Communication with Friends and Family: Not on file  . Frequency of Social Gatherings with Friends and Family: Not on file  . Attends Religious Services: Not on file  . Active Member of Clubs or Organizations: Not on file  . Attends Archivist Meetings: Not on file  . Marital Status: Not on file  Intimate Partner Violence:   . Fear of Current or Ex-Partner: Not on file   . Emotionally Abused: Not on file  . Physically Abused: Not on file  . Sexually Abused: Not on file    Review of Systems: 12 system ROS is negative except as noted above except for anxiety and urine leakage.   Physical Exam: General:   Alert,  well-nourished, pleasant and cooperative in NAD Head:  Normocephalic and atraumatic. Eyes:  Sclera clear, no icterus.   Conjunctiva pink. Ears:  Normal auditory acuity. Nose:  No deformity, discharge,  or lesions. Mouth:  No deformity or lesions.   Neck:  Supple; no masses or thyromegaly. Lungs:  Clear throughout to auscultation.   No wheezes. Heart:  Regular rate and rhythm; no murmurs. Abdomen:  Soft,nontender, nondistended, normal bowel sounds, no rebound or guarding. No hepatosplenomegaly.   Rectal:  Deferred  Msk:  Symmetrical. No  boney deformities LAD: No inguinal or umbilical LAD Extremities:  No clubbing or edema. Neurologic:  Alert and  oriented x4;  grossly nonfocal Skin:  Intact without significant lesions or rashes. Psych:  Alert and cooperative. Normal mood and affect.    Goldman Birchall L. Orvan Falconer, MD, MPH 01/26/2019, 2:36 PM

## 2019-01-26 NOTE — Patient Instructions (Addendum)
Given your rectal itching, I recommend the following: * Keep your bottom clean * Avoid moisture in the anal area * Apply zinc oxide to the anal area * Avoid tight fitting clothes * Wear cotton underpants * Avoid coffee, cola, beer, tomatoes, chocolate, tea, and citrus fruits as these can exacerbate anal itching  Tips for colonoscopy:  - Stay well hydrated for 3-4 days prior to the exam. This reduces nausea and dehydration.  - To prevent skin/hemorrhoid irritation - prior to wiping, put A&Dointment or vaseline on the toilet paper. - Keep a towel or pad on the bed.  - Drink  64oz of clear liquids in the morning of prep day (prior to starting the prep) to be sure that there is enough fluid to flush the colon and stay hydrated!!!! This is in addition to the fluids required for preparation. - Use of a flavored hard candy, such as grape Rubin Payor, can counteract some of the flavor of the prep and may prevent some nausea.   If you are age 40 or older, your body mass index should be between 23-30. Your Body mass index is 28.9 kg/m. If this is out of the aforementioned range listed, please consider follow up with your Primary Care Provider.  If you are age 40 or younger, your body mass index should be between 19-25. Your Body mass index is 28.9 kg/m. If this is out of the aformentioned range listed, please consider follow up with your Primary Care Provider.   You have been scheduled for a colonoscopy. Please follow written instructions given to you at your visit today.  Please pick up your prep supplies at the pharmacy within the next 1-3 days. If you use inhalers (even only as needed), please bring them with you on the day of your procedure.  We have sent the following medications to your pharmacy for you to pick up at your convenience:  START: famotidine 20mg  one tablet twice daily  Due to recent changes in healthcare laws, you may see the results of your imaging and laboratory studies on  MyChart before your provider has had a chance to review them.  We understand that in some cases there may be results that are confusing or concerning to you. Not all laboratory results come back in the same time frame and the provider may be waiting for multiple results in order to interpret others.  Please give 48 hours in order for your provider to thoroughly review all the results before contacting the office for clarification of your results.

## 2019-02-04 ENCOUNTER — Other Ambulatory Visit: Payer: Self-pay | Admitting: Gastroenterology

## 2019-02-04 ENCOUNTER — Ambulatory Visit (INDEPENDENT_AMBULATORY_CARE_PROVIDER_SITE_OTHER): Payer: BC Managed Care – PPO

## 2019-02-04 DIAGNOSIS — Z1159 Encounter for screening for other viral diseases: Secondary | ICD-10-CM

## 2019-02-04 LAB — SARS CORONAVIRUS 2 (TAT 6-24 HRS): SARS Coronavirus 2: NEGATIVE

## 2019-02-06 ENCOUNTER — Other Ambulatory Visit: Payer: Self-pay

## 2019-02-06 ENCOUNTER — Ambulatory Visit (AMBULATORY_SURGERY_CENTER): Payer: BC Managed Care – PPO | Admitting: Gastroenterology

## 2019-02-06 ENCOUNTER — Encounter: Payer: Self-pay | Admitting: Gastroenterology

## 2019-02-06 VITALS — BP 105/59 | HR 73 | Temp 98.4°F | Resp 12 | Ht 62.0 in | Wt 158.0 lb

## 2019-02-06 DIAGNOSIS — K625 Hemorrhage of anus and rectum: Secondary | ICD-10-CM | POA: Diagnosis not present

## 2019-02-06 DIAGNOSIS — R1030 Lower abdominal pain, unspecified: Secondary | ICD-10-CM | POA: Diagnosis not present

## 2019-02-06 DIAGNOSIS — K648 Other hemorrhoids: Secondary | ICD-10-CM

## 2019-02-06 MED ORDER — SODIUM CHLORIDE 0.9 % IV SOLN: 500.0000 mL | Freq: Once | INTRAVENOUS | Status: DC

## 2019-02-06 NOTE — Progress Notes (Signed)
Temp jb  VS DT  Patient does not consent to observer being present during procedure. Reviewed by admitting RN.

## 2019-02-06 NOTE — Op Note (Signed)
[name] Procedure Date: 02/06/2019 9:21 AM MRN: [id number] Endoscopist: Tina Park MD, MD Age: 39 Referring MD:  Date of Birth: 16-Nov-1980 Gender: Female Account #: [id number] Procedure:                Colonoscopy Indications:              Abdominal pain, Rectal pain                           Lower abdominal pain                           Rectal pain                           Mother with colon polyps Medicines:                Monitored Anesthesia Care Procedure:                Pre-Anesthesia Assessment:                           - Prior to the procedure, a History and Physical                            was performed, and patient medications and                            allergies were reviewed. The patient's tolerance of                            previous anesthesia was also reviewed. The risks                            and benefits of the procedure and the sedation                            options and risks were discussed with the patient.                            All questions were answered, and informed consent                            was obtained. Prior Anticoagulants: The patient has                            taken no previous anticoagulant or antiplatelet                            agents. ASA Grade Assessment: II - A patient with                            mild systemic disease. After reviewing the risks                            and benefits, the patient  was deemed in                            satisfactory condition to undergo the procedure.                           After obtaining informed consent, the colonoscope                            was passed under direct vision. Throughout the                            procedure, the patient's blood pressure, pulse, and                            oxygen saturations were monitored continuously. The                            Colonoscope was introduced through the anus  and                            advanced to the 4 cm into the ileum. A second                            forward view of the right colon was performed. The                            colonoscopy was performed without difficulty. The                            patient tolerated the procedure well. The quality                            of the bowel preparation was excellent. The                            terminal ileum, ileocecal valve, appendiceal                            orifice, and rectum were photographed. Scope In: 9:23:11 AM Scope Out: 9:33:21 AM Scope Withdrawal Time: 0 hours 6 minutes 54 seconds  Total Procedure Duration: 0 hours 10 minutes 10 seconds  Findings:                 The perianal and digital rectal examinations were                            normal.                           A localized area of mildly erythematous mucosa was                            found in the distal rectum. Biopsies were taken  with a cold forceps for histology. Estimated blood                            loss was minimal.                           The remainder of the entire examined colon appeared                            normal.                           The terminal ileum appeared normal.                           Non-bleeding internal hemorrhoids were found.                           The exam was otherwise without abnormality on                            direct and retroflexion views. Complications:            No immediate complications. Estimated blood loss:                            Minimal. Estimated Blood Loss:     Estimated blood loss was minimal. Impression:               - Erythematous mucosa in the distal rectum. This is                            of unclear clinical significance. But, given the                            recent symptoms, biopsies were obtained. This may                            be due to barotrauma or prep artifact. Biopsied.                            - The remainder of the entire examined colon is                            normal.                           - The examined portion of the ileum was normal.                           - Non-bleeding internal hemorrhoids.                           - The examination was otherwise normal on direct  and retroflexion views. Recommendation:           - Patient has a contact number available for                            emergencies. The signs and symptoms of potential                            delayed complications were discussed with the                            patient. Return to normal activities tomorrow.                            Written discharge instructions were provided to the                            patient.                           - Resume previous diet today.                           - Continue present medications.                           - Await pathology results.                           - Repeat colonoscopy at age 85 years for screening                            purposes given the family history. Tina Danas MD, MD 02/06/2019 9:42:48 AM This report has been signed electronically.

## 2019-02-06 NOTE — Progress Notes (Signed)
Report to PACU, RN, vss, BBS= Clear.  

## 2019-02-06 NOTE — Patient Instructions (Signed)
Handout on hemorrhoids given to you today   YOU HAD AN ENDOSCOPIC PROCEDURE TODAY AT THE [facility]:   Refer to the procedure report that was given to you for any specific questions about what was found during the examination.  If the procedure report does not answer your questions, please call your gastroenterologist to clarify.  If you requested that your care partner not be given the details of your procedure findings, then the procedure report has been included in a sealed envelope for you to review at your convenience later.  YOU SHOULD EXPECT: Some feelings of bloating in the abdomen. Passage of more gas than usual.  Walking can help get rid of the air that was put into your GI tract during the procedure and reduce the bloating. If you had a lower endoscopy (such as a colonoscopy or flexible sigmoidoscopy) you may notice spotting of blood in your stool or on the toilet paper. If you underwent a bowel prep for your procedure, you may not have a normal bowel movement for a few days.  Please Note:  You might notice some irritation and congestion in your nose or some drainage.  This is from the oxygen used during your procedure.  There is no need for concern and it should clear up in a day or so.  SYMPTOMS TO REPORT IMMEDIATELY:   Following lower endoscopy (colonoscopy or flexible sigmoidoscopy):  Excessive amounts of blood in the stool  Significant tenderness or worsening of abdominal pains  Swelling of the abdomen that is new, acute  Fever of 100F or higher  For urgent or emergent issues, a gastroenterologist can be reached at any hour by calling [phone].   DIET:  We do recommend a small meal at first, but then you may proceed to your regular diet.  Drink plenty of fluids but you should avoid alcoholic beverages for 24 hours.  ACTIVITY:  You should plan to take it easy for the rest of today and you should NOT DRIVE or use heavy machinery until tomorrow (because of the  sedation medicines used during the test).    FOLLOW UP: Our staff will call the number listed on your records 48-72 hours following your procedure to check on you and address any questions or concerns that you may have regarding the information given to you following your procedure. If we do not reach you, we will leave a message.  We will attempt to reach you two times.  During this call, we will ask if you have developed any symptoms of COVID 19. If you develop any symptoms (ie: fever, flu-like symptoms, shortness of breath, cough etc.) before then, please call [phone].  If you test positive for Covid 19 in the 2 weeks post procedure, please call and report this information to us.    If any biopsies were taken you will be contacted by phone or by letter within the next 1-3 weeks.  Please call us at [phone] if you have not heard about the biopsies in 3 weeks.    SIGNATURES/CONFIDENTIALITY: You and/or your care partner have signed paperwork which will be entered into your electronic medical record.  These signatures attest to the fact that that the information above on your After Visit Summary has been reviewed and is understood.  Full responsibility of the confidentiality of this discharge information lies with you and/or your care-partner. 

## 2019-02-06 NOTE — Progress Notes (Signed)
Called to room to assist during endoscopic procedure.  Patient ID and intended procedure confirmed with present staff. Received instructions for my participation in the procedure from the performing physician.  

## 2019-02-10 ENCOUNTER — Telehealth: Payer: Self-pay | Admitting: *Deleted

## 2019-02-10 NOTE — Telephone Encounter (Signed)
  Follow up Call-  Call back number 02/06/2019  Post procedure Call Back phone  # [phone]  Permission to leave phone message Yes     Patient questions:  Do you have a fever, pain , or abdominal swelling? No. Pain Score  0 *  Have you tolerated food without any problems? Yes.    Have you been able to return to your normal activities? Yes.    Do you have any questions about your discharge instructions: Diet   No. Medications  No. Follow up visit  No.  Do you have questions or concerns about your Care? No.  Actions: * If pain score is 4 or above: No action needed, pain <4.  1. Have you developed a fever since your procedure? no  2.   Have you had an respiratory symptoms (SOB or cough) since your procedure? no 3.   Have you tested positive for COVID 19 since your procedure no  4.   Have you had any family members/close contacts diagnosed with the COVID 19 since your procedure?  no   If yes to any of these questions please route to Laverna Peace, RN and Jennye Boroughs, Charity fundraiser.

## 2019-02-11 ENCOUNTER — Telehealth: Payer: Self-pay

## 2019-02-11 ENCOUNTER — Other Ambulatory Visit: Payer: Self-pay | Admitting: Family Medicine

## 2019-02-11 ENCOUNTER — Encounter: Payer: Self-pay | Admitting: *Deleted

## 2019-02-11 ENCOUNTER — Encounter: Payer: Self-pay | Admitting: Family Medicine

## 2019-02-11 DIAGNOSIS — R7303 Prediabetes: Secondary | ICD-10-CM

## 2019-02-11 DIAGNOSIS — E7800 Pure hypercholesterolemia, unspecified: Secondary | ICD-10-CM

## 2019-02-11 NOTE — Telephone Encounter (Signed)
Pt called to set up repeat A1C lab. She stated this was discussed at one of her recent OVs. Appt is tomorrow. Please put in orders. Thanks

## 2019-02-11 NOTE — Telephone Encounter (Signed)
Order placed

## 2019-02-12 ENCOUNTER — Other Ambulatory Visit: Payer: Self-pay

## 2019-02-12 ENCOUNTER — Other Ambulatory Visit (INDEPENDENT_AMBULATORY_CARE_PROVIDER_SITE_OTHER): Payer: BC Managed Care – PPO

## 2019-02-12 DIAGNOSIS — E7800 Pure hypercholesterolemia, unspecified: Secondary | ICD-10-CM

## 2019-02-12 DIAGNOSIS — R7303 Prediabetes: Secondary | ICD-10-CM | POA: Diagnosis not present

## 2019-02-12 LAB — LIPID PANEL
Cholesterol: 216 mg/dL — ABNORMAL HIGH (ref 0–200)
HDL: 51.3 mg/dL (ref >39.00)
LDL Cholesterol: 145 mg/dL — ABNORMAL HIGH (ref 0–99)
NonHDL: 165.17
Total CHOL/HDL Ratio: 4
Triglycerides: 99 mg/dL (ref 0.0–149.0)
VLDL: 19.8 mg/dL (ref 0.0–40.0)

## 2019-02-12 LAB — HEMOGLOBIN A1C: Hgb A1c MFr Bld: 6.1 % (ref 4.6–6.5)

## 2019-02-14 DIAGNOSIS — R140 Abdominal distension (gaseous): Secondary | ICD-10-CM | POA: Diagnosis not present

## 2019-02-20 ENCOUNTER — Ambulatory Visit: Payer: BC Managed Care – PPO | Admitting: Family Medicine

## 2019-02-25 ENCOUNTER — Ambulatory Visit (INDEPENDENT_AMBULATORY_CARE_PROVIDER_SITE_OTHER)
Admission: RE | Admit: 2019-02-25 | Discharge: 2019-02-25 | Disposition: A | Discharge status: Home / Self Care | Payer: BC Managed Care – PPO | Source: Ambulatory Visit | Attending: Family Medicine | Admitting: Family Medicine

## 2019-02-25 ENCOUNTER — Encounter: Payer: Self-pay | Admitting: Family Medicine

## 2019-02-25 ENCOUNTER — Ambulatory Visit: Payer: BC Managed Care – PPO | Admitting: Family Medicine

## 2019-02-25 ENCOUNTER — Other Ambulatory Visit: Payer: Self-pay

## 2019-02-25 VITALS — BP 112/72 | HR 76 | Temp 98.6°F | Ht 62.0 in | Wt 154.0 lb

## 2019-02-25 DIAGNOSIS — S99922A Unspecified injury of left foot, initial encounter: Secondary | ICD-10-CM

## 2019-02-25 DIAGNOSIS — E663 Overweight: Secondary | ICD-10-CM

## 2019-02-25 DIAGNOSIS — M79672 Pain in left foot: Secondary | ICD-10-CM | POA: Diagnosis not present

## 2019-02-25 NOTE — Patient Instructions (Signed)
Good to see you today  I will notify you of xray results  A resource that I like is www.dietdoctor.com/diabetes/diet  Look at [employer]- Dr. Wylene Simmer, Dr. Allyson Sabal, yoga with Hansel Starling  Here are some guidelines to help you with meal planning -  Avoid all processed and packaged foods (bread, pasta, crackers, chips, etc) and beverages containing calories.  Avoid added sugars and excessive natural sugars.  Attention to how you feel if you consume artificial sweeteners.  Do they make you more hungry or raise your blood sugar?  With every meal and snack, aim to get 20 g of protein (3 ounces of meat, 4 ounces of fish, 3 eggs, protein powder, 1 cup [place] yogurt, 1 cup cottage cheese, etc.)  Increase fiber in the form of non-starchy vegetables.  These help you feel full with very little carbohydrates and are good for gut health.  Eat 1 serving healthy carb per meal- 1/2 cup brown rice, beans, potato, corn- pay attention to whether or not this significantly raises your blood sugar. If it does, reduce the frequency you consume these.   Eat 2-3 servings of lower sugar fruits daily.  This includes berries, apples, oranges, peaches, pears, one half banana.  Have small amounts of good fats such as avocado, nuts, olive oil, nut butters, olives.  Add a little cheese to your salads to make them tasty.

## 2019-02-25 NOTE — Progress Notes (Signed)
Subjective:    Patient ID: Tina Schmidt, female    DOB: 10-13-1980, 39 y.o.   MRN: [id number]  HPI Chief Complaint  Patient presents with  . Toe Pain    Pt c/o pain in 2nd toes on left foot. Started 02/15/2019. Denies redness, warmth, swelling. Having some tenderness to touch. Pt has been using Epson salt soaks and Ibuprofen 800mg  and not seeming to help. Pt does not recall injuring -- she does exercise a lot.   This is a 39 year old female who presents today with above chief complaint.  She noticed pain on the bottom of her left foot below her second toe about 10 days ago.  Prior to the pain starting, she had been doing boot camp 4 times a week for the 2 previous weeks.  She does not recall any specific injury or overuse.  Initially, she had very difficult time flexing or extending her toe.  Pain has improved slightly and mobility is increased and she is now able to wear her regular shoes and compression sock to work whereas before she could not.  Frustrated with her weight.  Has been trying to increase her whole grains, vegetables and fruits.  She is eating instant oatmeal flavored with apples and cinnamon and sugar for breakfast, a salad with tuna and fruit for lunch.  Has had small amount of weight loss but would like more.    Review of Systems Per HPI    Objective:   Physical Exam Vitals reviewed.  Constitutional:      General: She is not in acute distress.    Appearance: Normal appearance. She is normal weight. She is not ill-appearing, toxic-appearing or diaphoretic.  HENT:     Head: Normocephalic and atraumatic.  Eyes:     Conjunctiva/sclera: Conjunctivae normal.  Cardiovascular:     Rate and Rhythm: Normal rate.  Pulmonary:     Effort: Pulmonary effort is normal.  Musculoskeletal:     Left foot: Decreased range of motion (second toe). Tenderness present. No bony tenderness. Normal pulse.       Feet:  Skin:    General: Skin is warm and dry.  Neurological:     Mental  Status: She is alert and oriented to person, place, and time.  Psychiatric:        Mood and Affect: Mood normal.        Behavior: Behavior normal.        Thought Content: Thought content normal.        Judgment: Judgment normal.          BP 112/72 (BP Location: Left Arm, Patient Position: Sitting, Cuff Size: Normal)   Pulse 76   Temp 98.6 F (37 C) (Temporal)   Ht 5\' 2"  (1.575 m)   Wt 154 lb (69.9 kg)   SpO2 98%   BMI 28.17 kg/m  Wt Readings from Last 3 Encounters:  02/25/19 154 lb (69.9 kg)  02/06/19 158 lb (71.7 kg)  01/26/19 158 lb (71.7 kg)    Assessment & Plan:  1. Injury of left foot, initial encounter -We will get x-ray due to possible related to trauma -Over-the-counter analgesics/heat/ice as needed - DG Foot Complete Left; Future  2. Overweight (BMI 25.0-29.9) -Provided written and verbal information regarding healthy food choices  This visit occurred during the SARS-CoV-2 public health emergency.  Safety protocols were in place, including screening questions prior to the visit, additional usage of staff PPE, and extensive cleaning of exam room while observing appropriate contact  time as indicated for disinfecting solutions.      Clarene Reamer, FNP-BC  [facility]  02/25/2019 5:05 PM

## 2019-03-18 ENCOUNTER — Other Ambulatory Visit: Payer: Self-pay | Admitting: Family Medicine

## 2019-03-18 ENCOUNTER — Encounter: Payer: Self-pay | Admitting: Family Medicine

## 2019-03-18 MED ORDER — NITROFURANTOIN MONOHYD MACRO 100 MG PO CAPS: 100.0000 mg | ORAL_CAPSULE | Freq: Two times a day (BID) | ORAL | 0 refills | Status: AC

## 2019-03-18 NOTE — Telephone Encounter (Signed)
See patient message above  Pt feels she had a UTI, unable to come in for a visit or bring urine by. I have explained the importance of [place] having both. Awaiting response from patient.   Please advise, thanks.

## 2019-03-30 ENCOUNTER — Other Ambulatory Visit: Payer: Self-pay | Admitting: Family Medicine

## 2019-03-30 ENCOUNTER — Encounter: Payer: Self-pay | Admitting: Family Medicine

## 2019-03-30 DIAGNOSIS — F329 Major depressive disorder, single episode, unspecified: Secondary | ICD-10-CM

## 2019-03-30 DIAGNOSIS — F32A Depression, unspecified: Secondary | ICD-10-CM

## 2019-03-30 MED ORDER — BUPROPION HCL ER (XL) 150 MG PO TB24: 150.0000 mg | ORAL_TABLET | Freq: Every day | ORAL | 1 refills | Status: DC

## 2019-03-30 NOTE — Telephone Encounter (Signed)
Last OV 10/03/18 for anxiety/depression, patient has been seen several times acutely since this appt.  Last refill 10/03/18 #30 x 5 refills.   Please advise, thanks.

## 2019-05-27 ENCOUNTER — Other Ambulatory Visit: Payer: Self-pay | Admitting: Family Medicine

## 2019-05-27 DIAGNOSIS — B372 Candidiasis of skin and nail: Secondary | ICD-10-CM

## 2019-05-28 NOTE — Telephone Encounter (Signed)
Last filled on 11/11/17 #30 with 1 refill  LOV 02/25/19 for toe pain. No future appointments

## 2019-06-10 DIAGNOSIS — L731 Pseudofolliculitis barbae: Secondary | ICD-10-CM | POA: Insufficient documentation

## 2019-06-10 DIAGNOSIS — L680 Hirsutism: Secondary | ICD-10-CM | POA: Diagnosis not present

## 2019-06-10 HISTORY — DX: Pseudofolliculitis barbae: L73.1

## 2019-07-23 ENCOUNTER — Encounter: Payer: Self-pay | Admitting: Family Medicine

## 2019-07-30 ENCOUNTER — Encounter: Payer: Self-pay | Admitting: Family Medicine

## 2019-07-31 NOTE — Telephone Encounter (Signed)
No availability with you today other than 4:15 slot but she is requesting an xray.   May need to get in with another Provider today.   Pt has not called the office yet as stated in her message.   Please advise, thanks.

## 2019-08-03 ENCOUNTER — Ambulatory Visit: Payer: BC Managed Care – PPO | Admitting: Family Medicine

## 2019-08-03 ENCOUNTER — Encounter: Payer: Self-pay | Admitting: Family Medicine

## 2019-08-03 ENCOUNTER — Other Ambulatory Visit: Payer: Self-pay

## 2019-08-03 ENCOUNTER — Telehealth: Payer: Self-pay

## 2019-08-03 VITALS — BP 120/90 | HR 84 | Temp 98.4°F | Ht 62.0 in | Wt 165.5 lb

## 2019-08-03 DIAGNOSIS — M549 Dorsalgia, unspecified: Secondary | ICD-10-CM

## 2019-08-03 DIAGNOSIS — M25512 Pain in left shoulder: Secondary | ICD-10-CM | POA: Diagnosis not present

## 2019-08-03 MED ORDER — PREDNISONE 20 MG PO TABS: ORAL_TABLET | ORAL | 0 refills | Status: DC

## 2019-08-03 NOTE — Telephone Encounter (Signed)
Noted  
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
## 2019-08-03 NOTE — Telephone Encounter (Signed)
Pt has apt with Dr. Patsy Lager today.

## 2019-08-27 ENCOUNTER — Other Ambulatory Visit: Payer: Self-pay | Admitting: Family Medicine

## 2019-08-27 DIAGNOSIS — E559 Vitamin D deficiency, unspecified: Secondary | ICD-10-CM

## 2019-08-27 DIAGNOSIS — E663 Overweight: Secondary | ICD-10-CM

## 2019-08-27 DIAGNOSIS — R7303 Prediabetes: Secondary | ICD-10-CM

## 2019-08-28 ENCOUNTER — Other Ambulatory Visit (INDEPENDENT_AMBULATORY_CARE_PROVIDER_SITE_OTHER): Payer: BC Managed Care – PPO

## 2019-08-28 ENCOUNTER — Other Ambulatory Visit: Payer: Self-pay | Admitting: Family Medicine

## 2019-08-28 ENCOUNTER — Other Ambulatory Visit: Payer: Self-pay

## 2019-08-28 DIAGNOSIS — E663 Overweight: Secondary | ICD-10-CM | POA: Diagnosis not present

## 2019-08-28 DIAGNOSIS — R7303 Prediabetes: Secondary | ICD-10-CM

## 2019-08-28 DIAGNOSIS — E559 Vitamin D deficiency, unspecified: Secondary | ICD-10-CM | POA: Diagnosis not present

## 2019-08-28 LAB — CBC WITH DIFFERENTIAL/PLATELET
Basophils Absolute: 0.1 10*3/uL (ref 0.0–0.1)
Basophils Relative: 1.2 % (ref 0.0–3.0)
Eosinophils Absolute: 0.1 10*3/uL (ref 0.0–0.7)
Eosinophils Relative: 2 % (ref 0.0–5.0)
HCT: 38.9 % (ref 36.0–46.0)
Hemoglobin: 13 g/dL (ref 12.0–15.0)
Lymphocytes Relative: 46.1 % — ABNORMAL HIGH (ref 12.0–46.0)
Lymphs Abs: 3.2 10*3/uL (ref 0.7–4.0)
MCHC: 33.3 g/dL (ref 30.0–36.0)
MCV: 85.1 fl (ref 78.0–100.0)
Monocytes Absolute: 0.5 10*3/uL (ref 0.1–1.0)
Monocytes Relative: 7.4 % (ref 3.0–12.0)
Neutro Abs: 3 10*3/uL (ref 1.4–7.7)
Neutrophils Relative %: 43.3 % (ref 43.0–77.0)
Platelets: 333 10*3/uL (ref 150.0–400.0)
RBC: 4.58 Mil/uL (ref 3.87–5.11)
RDW: 13.1 % (ref 11.5–15.5)
WBC: 6.9 10*3/uL (ref 4.0–10.5)

## 2019-08-28 LAB — LIPID PANEL
Cholesterol: 200 mg/dL (ref 0–200)
HDL: 50.1 mg/dL (ref >39.00)
LDL Cholesterol: 129 mg/dL — ABNORMAL HIGH (ref 0–99)
NonHDL: 149.91
Total CHOL/HDL Ratio: 4
Triglycerides: 103 mg/dL (ref 0.0–149.0)
VLDL: 20.6 mg/dL (ref 0.0–40.0)

## 2019-08-28 LAB — COMPREHENSIVE METABOLIC PANEL
ALT: 15 U/L (ref 0–35)
AST: 17 U/L (ref 0–37)
Albumin: 4.6 g/dL (ref 3.5–5.2)
Alkaline Phosphatase: 70 U/L (ref 39–117)
BUN: 10 mg/dL (ref 6–23)
CO2: 27 mEq/L (ref 19–32)
Calcium: 10.3 mg/dL (ref 8.4–10.5)
Chloride: 103 mEq/L (ref 96–112)
Creatinine, Ser: 1.09 mg/dL (ref 0.40–1.20)
GFR: 67.26 mL/min (ref >60.00)
Glucose, Bld: 97 mg/dL (ref 70–99)
Potassium: 4.7 mEq/L (ref 3.5–5.1)
Sodium: 137 mEq/L (ref 135–145)
Total Bilirubin: 0.4 mg/dL (ref 0.2–1.2)
Total Protein: 7.6 g/dL (ref 6.0–8.3)

## 2019-08-28 LAB — HEMOGLOBIN A1C: Hgb A1c MFr Bld: 6.1 % (ref 4.6–6.5)

## 2019-08-28 LAB — TSH: TSH: 1.32 u[IU]/mL (ref 0.35–4.50)

## 2019-08-28 LAB — VITAMIN D 25 HYDROXY (VIT D DEFICIENCY, FRACTURES): VITD: 26.89 ng/mL — ABNORMAL LOW (ref 30.00–100.00)

## 2019-08-28 NOTE — Addendum Note (Signed)
Addended by: Aquilla Solian on: 08/28/2019 10:47 AM   Modules accepted: Orders

## 2019-09-04 ENCOUNTER — Other Ambulatory Visit: Payer: Self-pay

## 2019-09-04 ENCOUNTER — Encounter: Payer: Self-pay | Admitting: Family Medicine

## 2019-09-04 ENCOUNTER — Ambulatory Visit (INDEPENDENT_AMBULATORY_CARE_PROVIDER_SITE_OTHER): Payer: BC Managed Care – PPO | Admitting: Family Medicine

## 2019-09-04 VITALS — BP 122/76 | HR 81 | Temp 98.1°F | Ht 62.5 in | Wt 160.2 lb

## 2019-09-04 DIAGNOSIS — E559 Vitamin D deficiency, unspecified: Secondary | ICD-10-CM | POA: Diagnosis not present

## 2019-09-04 DIAGNOSIS — E663 Overweight: Secondary | ICD-10-CM | POA: Diagnosis not present

## 2019-09-04 DIAGNOSIS — R7303 Prediabetes: Secondary | ICD-10-CM

## 2019-09-04 DIAGNOSIS — Z0000 Encounter for general adult medical examination without abnormal findings: Secondary | ICD-10-CM | POA: Diagnosis not present

## 2019-09-04 NOTE — Patient Instructions (Signed)
Good to see you today  Please call and schedule an appointment for screening mammogram. A referral is not needed.  [phone]   Keep up the good work with your diet and exercise  Suggested resources- www.CityCalculator.nl www.adaptyourlifeacademy.com-there is a quiz to help you determine how many carbohydrates you should eat a day  www.thefastingmethod.com

## 2019-09-04 NOTE — Progress Notes (Signed)
Subjective:    Patient ID: Tina Schmidt, female    DOB: August 11, 1980, 39 y.o.   MRN: [id number]  HPI Chief Complaint  Patient presents with  . Annual Exam   This is a 39 year old female presents today for annual exam.  Last CPE- 2020 Mammo-never Pap- 03/14/2017, negative, negative HPV Tdap-she thinks she had it in 2013. She will look for her records at home and send to me. Flu- annual Covid 19 vaccine- fully vaccinated Eye-has not had for several years, will schedule Dental-regular Exercise-has resumed recently Diet- breakfast- old fashion oatmeal, lunch- salad with tuna, dinner- chicken, potatoes, veggies, little snacking     Review of Systems  Constitutional: Negative.   HENT: Negative.   Eyes: Negative.   Respiratory: Negative.   Cardiovascular: Negative.   Gastrointestinal: Positive for constipation (occasional with dairy).  Endocrine: Negative.   Genitourinary: Negative.   Musculoskeletal: Positive for back pain (occasional, not as bad as previously).  Skin:       Sees derm for scalp issues.   Allergic/Immunologic: Negative.   Neurological: Negative.   Hematological: Negative.   Psychiatric/Behavioral: Negative.        Objective:   Physical Exam Physical Exam  Constitutional: She is oriented to person, place, and time. She appears well-developed and well-nourished. No distress.  HENT:  Head: Normocephalic and atraumatic.  Right Ear: External ear normal. TM normal.  Left Ear: External ear normal. TM normal.  Nose: Nose normal.  Mouth/Throat: Oropharynx is clear and moist. No oropharyngeal exudate.  Eyes: Conjunctivae are normal.   Neck: Normal range of motion. Neck supple. No JVD present. No thyromegaly present.  Cardiovascular: Normal rate, regular rhythm, normal heart sounds and intact distal pulses.   Pulmonary/Chest: Effort normal and breath sounds normal. Right breast exhibits no inverted nipple, no mass, no nipple discharge, no skin change and no  tenderness. Left breast exhibits no inverted nipple, no mass, no nipple discharge, no skin change and no tenderness. Breasts are symmetrical.  Abdominal: Soft. Bowel sounds are normal. She exhibits no distension and no mass. There is no tenderness. There is no rebound and no guarding.  Musculoskeletal: Normal range of motion. She exhibits no edema or tenderness.  Lymphadenopathy:    She has no cervical adenopathy.  Neurological: She is alert and oriented to person, place, and time.   Skin: Skin is warm and dry. She is not diaphoretic.  Psychiatric: She has a normal mood and affect. Her behavior is normal. Judgment and thought content normal.  Vitals reviewed.     BP 122/76   Pulse 81   Temp 98.1 F (36.7 C) (Temporal)   Ht 5' 2.5" (1.588 m)   Wt 160 lb 4 oz (72.7 kg)   SpO2 97%   BMI 28.84 kg/m  Wt Readings from Last 3 Encounters:  09/04/19 160 lb 4 oz (72.7 kg)  08/03/19 165 lb 8 oz (75.1 kg)  02/25/19 154 lb (69.9 kg)       Assessment & Plan:  1. Annual physical exam -- Discussed and encouraged healthy lifestyle choices- adequate sleep, regular exercise, stress management and healthy food choices.  -She is due mammogram and provided her the number to call and schedule [facility]  2. Overweight (BMI 25.0-29.9) -Discussed her current eating patterns and provided written and verbal recommendations for increase nonstarchy vegetables, adequate protein, intermittent fasting  3. Prediabetes -Per #2  4. Vitamin D deficiency -Still a little low on 1000 IU daily, will have her increase to  2000 IU daily  This visit occurred during the SARS-CoV-2 public health emergency.  Safety protocols were in place, including screening questions prior to the visit, additional usage of staff PPE, and extensive cleaning of exam room while observing appropriate contact time as indicated for disinfecting solutions.      Olean Ree, FNP-BC  [facility] at [facility], [facility]  09/04/2019 9:27 AM

## 2019-09-29 ENCOUNTER — Other Ambulatory Visit: Payer: Self-pay | Admitting: Family Medicine

## 2019-09-29 DIAGNOSIS — Z1231 Encounter for screening mammogram for malignant neoplasm of breast: Secondary | ICD-10-CM

## 2019-10-14 ENCOUNTER — Other Ambulatory Visit: Payer: Self-pay

## 2019-10-14 ENCOUNTER — Ambulatory Visit
Admission: RE | Admit: 2019-10-14 | Discharge: 2019-10-14 | Disposition: A | Discharge status: Home / Self Care | Payer: BC Managed Care – PPO | Source: Ambulatory Visit | Attending: Family Medicine | Admitting: Family Medicine

## 2019-10-14 DIAGNOSIS — Z1231 Encounter for screening mammogram for malignant neoplasm of breast: Secondary | ICD-10-CM | POA: Diagnosis not present

## 2019-11-29 ENCOUNTER — Encounter: Payer: Self-pay | Admitting: Family Medicine

## 2019-11-30 NOTE — Telephone Encounter (Signed)
Is it possible to change codes for labs per patient request?

## 2019-12-15 ENCOUNTER — Encounter: Payer: Self-pay | Admitting: Family Medicine

## 2019-12-23 ENCOUNTER — Other Ambulatory Visit: Payer: Self-pay

## 2019-12-23 ENCOUNTER — Ambulatory Visit: Payer: BC Managed Care – PPO | Admitting: Family Medicine

## 2019-12-23 ENCOUNTER — Encounter: Payer: Self-pay | Admitting: Family Medicine

## 2019-12-23 ENCOUNTER — Ambulatory Visit (INDEPENDENT_AMBULATORY_CARE_PROVIDER_SITE_OTHER)
Admission: RE | Admit: 2019-12-23 | Discharge: 2019-12-23 | Disposition: A | Discharge status: Home / Self Care | Payer: BC Managed Care – PPO | Source: Ambulatory Visit | Attending: Family Medicine | Admitting: Family Medicine

## 2019-12-23 VITALS — BP 138/80 | HR 95 | Temp 98.0°F | Ht 62.5 in | Wt 158.1 lb

## 2019-12-23 DIAGNOSIS — M25561 Pain in right knee: Secondary | ICD-10-CM

## 2019-12-23 MED ORDER — CYCLOBENZAPRINE HCL 10 MG PO TABS: 10.0000 mg | ORAL_TABLET | Freq: Three times a day (TID) | ORAL | 0 refills | Status: DC | PRN

## 2019-12-23 MED ORDER — DICLOFENAC SODIUM 75 MG PO TBEC: 75.0000 mg | DELAYED_RELEASE_TABLET | Freq: Two times a day (BID) | ORAL | 0 refills | Status: DC | PRN

## 2019-12-23 NOTE — Patient Instructions (Signed)
Good to see you today  I will notify you of xray results 

## 2019-12-23 NOTE — Progress Notes (Signed)
   Subjective:    Patient ID: Tina Schmidt, female    DOB: 1980/03/02, 39 y.o.   MRN: [id number]  HPI Chief Complaint  Patient presents with  . Knee Pain    Pt stated that she woke up on 12/20/19 with pain in her Rt knee.Nothing she did to trigger the onset of pain   This is a 39 yo female who presents today with above cc. Now working at [employer]. Long history of knee clicking, no pain or instability. Last week she ran to a resident code, knee was ok for several days, then had acute onset on right knee pain several days later, awoke and couldn't bend her knee. Has been taking ibuprofen 800 mg TID without relief. Pain above and below kneecap, feels tight, pain is constant, difficulty sleeping. Has been resting, wearing a brace, elevating, ice/ heat.  She has been using crutches some around her home.  Has not been able to go to work.   Review of Systems Per HPI    Objective:   Physical Exam Vitals reviewed.  Constitutional:      General: She is not in acute distress.    Appearance: Normal appearance. She is normal weight. She is not ill-appearing, toxic-appearing or diaphoretic.  HENT:     Head: Normocephalic and atraumatic.  Eyes:     Conjunctiva/sclera: Conjunctivae normal.  Cardiovascular:     Rate and Rhythm: Normal rate.  Pulmonary:     Effort: Pulmonary effort is normal.  Musculoskeletal:     Right knee: No swelling, deformity or crepitus. Decreased range of motion. Tenderness present. Normal alignment and normal patellar mobility.     Right lower leg: No edema.     Left lower leg: No edema.  Skin:    General: Skin is warm and dry.  Neurological:     Mental Status: She is alert and oriented to person, place, and time.       BP 138/80 (BP Location: Right Arm, Patient Position: Sitting, Cuff Size: Normal)   Pulse 95   Temp 98 F (36.7 C) (Temporal)   Ht 5' 2.5" (1.588 m)   Wt 158 lb 1.9 oz (71.7 kg)   SpO2 99%   BMI 28.46 kg/m  Wt Readings from Last 3  Encounters:  12/23/19 158 lb 1.9 oz (71.7 kg)  09/04/19 160 lb 4 oz (72.7 kg)  08/03/19 165 lb 8 oz (75.1 kg)       Assessment & Plan:  1. Acute pain of right knee -Continue rest, heat/ice, will change NSAID and add muscle relaxer, encouraged her to do gentle range of motion as able, crutches to avoid weightbearing -She will schedule follow-up with sports medicine - DG Knee Complete 4 Views Right; Future - diclofenac (VOLTAREN) 75 MG EC tablet; Take 1 tablet (75 mg total) by mouth 2 (two) times daily as needed.  Dispense: 30 tablet; Refill: 0 - cyclobenzaprine (FLEXERIL) 10 MG tablet; Take 1 tablet (10 mg total) by mouth 3 (three) times daily as needed for muscle spasms.  Dispense: 30 tablet; Refill: 0  This visit occurred during the SARS-CoV-2 public health emergency.  Safety protocols were in place, including screening questions prior to the visit, additional usage of staff PPE, and extensive cleaning of exam room while observing appropriate contact time as indicated for disinfecting solutions.    Olean Ree, FNP-BC  [facility] at [facility], [facility]  12/24/2019 1:27 PM
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
## 2019-12-28 ENCOUNTER — Ambulatory Visit: Payer: Self-pay | Admitting: Family Medicine

## 2019-12-28 DIAGNOSIS — Z0289 Encounter for other administrative examinations: Secondary | ICD-10-CM

## 2020-01-01 ENCOUNTER — Other Ambulatory Visit: Payer: Self-pay | Admitting: Family Medicine

## 2020-01-01 DIAGNOSIS — F32A Depression, unspecified: Secondary | ICD-10-CM

## 2020-01-01 NOTE — Telephone Encounter (Signed)
Pharmacy requests refill on: Bupropion 150 mg 24 hr    LAST REFILL: 03/30/2019 (Q-90, R-1)  LAST OV: 12/28/2019 NEXT OV: Not Scheduled  PHARMACY: [id number] [place], [place]

## 2020-01-04 ENCOUNTER — Other Ambulatory Visit: Payer: Self-pay | Admitting: Family Medicine

## 2020-01-04 DIAGNOSIS — M25561 Pain in right knee: Secondary | ICD-10-CM

## 2020-01-04 NOTE — Telephone Encounter (Signed)
Last Prescribed on 12/23/2019. Last OV on 12/23/2019.

## 2020-02-24 ENCOUNTER — Telehealth: Payer: Self-pay | Admitting: Family Medicine

## 2020-02-24 NOTE — Telephone Encounter (Signed)
Patient called in and states that she had some blood work done back August of 2021 and supposedly the wrong dxcodes were sent in with her blood work and the ins company told her that she need to call us to have them changed. The dxcode should be for obesity ( is what they said they would need in order to cover it) Patient states that she has had several mychart messages back and forth to [place] about getting it changed. Patient is requesting a call back to discuss . EM

## 2020-02-25 ENCOUNTER — Encounter: Payer: Self-pay | Admitting: Family Medicine

## 2020-02-25 ENCOUNTER — Ambulatory Visit: Payer: BC Managed Care – PPO | Admitting: Family Medicine

## 2020-02-25 ENCOUNTER — Other Ambulatory Visit: Payer: Self-pay

## 2020-02-25 VITALS — BP 110/90 | HR 77 | Temp 98.7°F | Ht 62.5 in | Wt 156.8 lb

## 2020-02-25 DIAGNOSIS — N390 Urinary tract infection, site not specified: Secondary | ICD-10-CM | POA: Diagnosis not present

## 2020-02-25 DIAGNOSIS — R3915 Urgency of urination: Secondary | ICD-10-CM | POA: Diagnosis not present

## 2020-02-25 MED ORDER — CIPROFLOXACIN HCL 500 MG PO TABS: 500.0000 mg | ORAL_TABLET | Freq: Two times a day (BID) | ORAL | 0 refills | Status: AC

## 2020-02-25 NOTE — Progress Notes (Signed)
Jeromey Kruer T. Janie Capp, MD, CAQ Sports Medicine  Primary Care and Sports Medicine [facility] at [facility] [address] [place], [address]  Phone: [phone]  FAX: [phone]  Tina Schmidt - 40 y.o. female  MRN [id number]  Date of Birth: [birth year]  Date: 02/25/2020  PCP: Emi Belfast, FNP (Inactive)  Referral: No ref. provider found  Chief Complaint  Patient presents with  . Back Pain    Left Sided  . Urinary Urgency    This visit occurred during the SARS-CoV-2 public health emergency.  Safety protocols were in place, including screening questions prior to the visit, additional usage of staff PPE, and extensive cleaning of exam room while observing appropriate contact time as indicated for disinfecting solutions.   Subjective:   Tina Schmidt is a 40 y.o. very pleasant female patient with Body mass index is 28.21 kg/m. who presents with the following:  Lower back on the L > R and aching some.  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  She is having some urgency and burning.  She took some AZO yesterday, and this did help with her symptoms.  She approximately gets UTIs twice a year.  At this point her stomach pain and her back pain have gone entirely away, but she does have some upper flank pain on the left.  Urgency, burning,  AZO - did help.   2 time a year.   Stomach and back went away.   She denies any focal back pain, radicular symptoms, significant nauseousness diarrhea.  She also denies any rhinosinusitis complaints or any other symptoms aside from what is listed above.  Review of Systems is noted in the HPI, as appropriate  Objective:   BP 110/90   Pulse 77   Temp 98.7 F (37.1 C) (Temporal)   Ht 5' 2.5" (1.588 m)   Wt 156 lb 12 oz (71.1 kg)   LMP 02/23/2020   SpO2 98%   BMI 28.21 kg/m   GEN: No acute distress; alert,appropriate. PULM: Breathing  comfortably in no respiratory distress PSYCH: Normally interactive.  ABD: S, NT, ND, + BS, No rebound, No HSM  She does have CVAT on the L  Laboratory and Imaging Data:  Assessment and Plan:     ICD-10-CM   1. Urinary tract infection without hematuria, site unspecified  N39.0   2. Urinary urgency  R39.15 Urine Culture   Her UA was of limited help since she is currently on her menses, and it was pinkish-red.  She also has been using AZO.  I will treat for presumptive UTI.  I worried that she may have some early pyelonephritis given that she is having some focal back pain around the area of her left kidney.  I will treat this as if this is pyelonephritis.  Culture is pending.  Meds ordered this encounter  Medications  . ciprofloxacin (CIPRO) 500 MG tablet    Sig: Take 1 tablet (500 mg total) by mouth 2 (two) times daily for 7 days.    Dispense:  14 tablet    Refill:  0   Medications Discontinued During This Encounter  Medication Reason  . diclofenac (VOLTAREN) 75 MG EC tablet Patient Preference   Orders Placed This Encounter  Procedures  . Urine Culture    Follow-up: No follow-ups on file.  Signed,  Elpidio Galea. Naina Sleeper, MD   Outpatient Encounter Medications as of 02/25/2020  Medication Sig  .  buPROPion (WELLBUTRIN XL) 150 MG 24 hr tablet TAKE 1 TABLET(150 MG) BY MOUTH DAILY  . ciprofloxacin (CIPRO) 500 MG tablet Take 1 tablet (500 mg total) by mouth 2 (two) times daily for 7 days.  . cyclobenzaprine (FLEXERIL) 10 MG tablet Take 1 tablet (10 mg total) by mouth 3 (three) times daily as needed for muscle spasms.  . famotidine (PEPCID) 20 MG tablet Take 1 tablet (20 mg total) by mouth 2 (two) times daily.  Marland Kitchen ketoconazole (NIZORAL) 2 % shampoo   . nystatin cream (MYCOSTATIN) APPLY EXTERNALLY TO THE AFFECTED AREA TWICE DAILY  . [DISCONTINUED] diclofenac (VOLTAREN) 75 MG EC tablet TAKE 1 TABLET(75 MG) BY MOUTH TWICE DAILY AS NEEDED   Facility-Administered Encounter  Medications as of 02/25/2020  Medication  . 0.9 %  sodium chloride infusion

## 2020-02-27 LAB — URINE CULTURE
MICRO NUMBER:: 11519576
SPECIMEN QUALITY:: ADEQUATE

## 2020-03-09 MED ORDER — CIPROFLOXACIN HCL 500 MG PO TABS: 500.0000 mg | ORAL_TABLET | Freq: Two times a day (BID) | ORAL | 0 refills | Status: DC

## 2020-03-16 DIAGNOSIS — L731 Pseudofolliculitis barbae: Secondary | ICD-10-CM | POA: Diagnosis not present

## 2020-03-16 DIAGNOSIS — L680 Hirsutism: Secondary | ICD-10-CM | POA: Diagnosis not present

## 2020-03-16 DIAGNOSIS — L219 Seborrheic dermatitis, unspecified: Secondary | ICD-10-CM | POA: Diagnosis not present

## 2020-03-21 MED ORDER — FLUCONAZOLE 150 MG PO TABS
ORAL_TABLET | ORAL | 0 refills | Status: DC
Start: 2020-03-21 — End: 2020-09-08

## 2020-05-02 DIAGNOSIS — M25511 Pain in right shoulder: Secondary | ICD-10-CM | POA: Diagnosis not present

## 2020-05-19 DIAGNOSIS — M25511 Pain in right shoulder: Secondary | ICD-10-CM | POA: Diagnosis not present

## 2020-07-24 IMAGING — US US PELVIS COMPLETE WITH TRANSVAGINAL
2 series · 13 of 25 positions shown · non-contrast
Comparison: None

CLINICAL DATA: RIGHT lower quadrant pain, pain with intercourse for
6 months; prior surgery to removed IUD with loss during

EXAM:
TRANSABDOMINAL AND TRANSVAGINAL ULTRASOUND OF PELVIS
TECHNIQUE: Both transabdominal and transvaginal ultrasound examinations of the
pelvis were performed. Transabdominal technique was performed for
global imaging of the pelvis including uterus, ovaries, adnexal
regions, and pelvic cul-de-sac. It was necessary to proceed with
endovaginal exam following the transabdominal exam to visualize the
endometrium and ovaries.

[Series 1: us pelvis complete with transvaginal · 0.28mm/px · 50 acquisitions, 12 frames shown (1 of 2)]
[im 1/50]
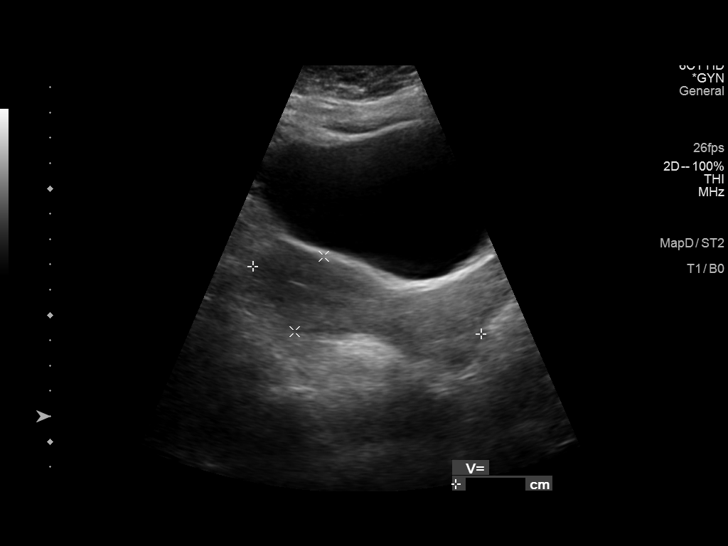
[im 5/50]
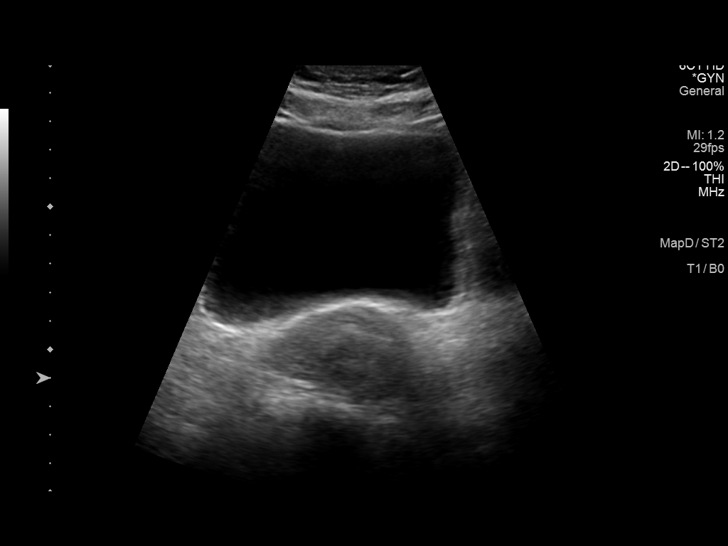
[im 9/50]
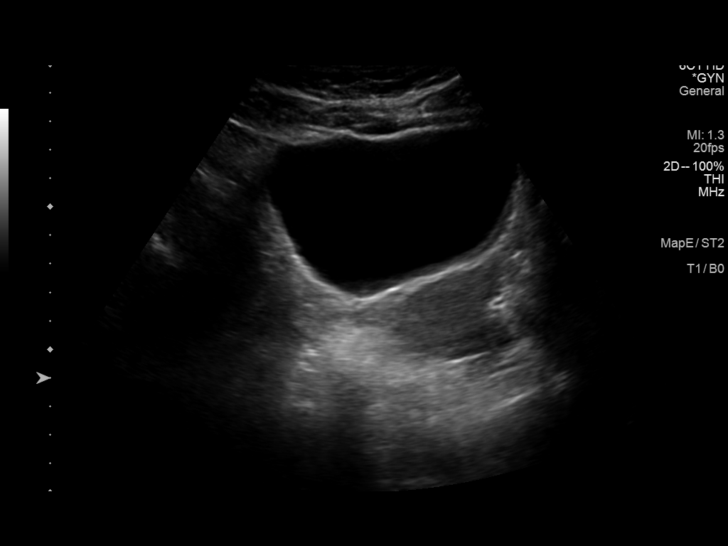
[im 13/50]
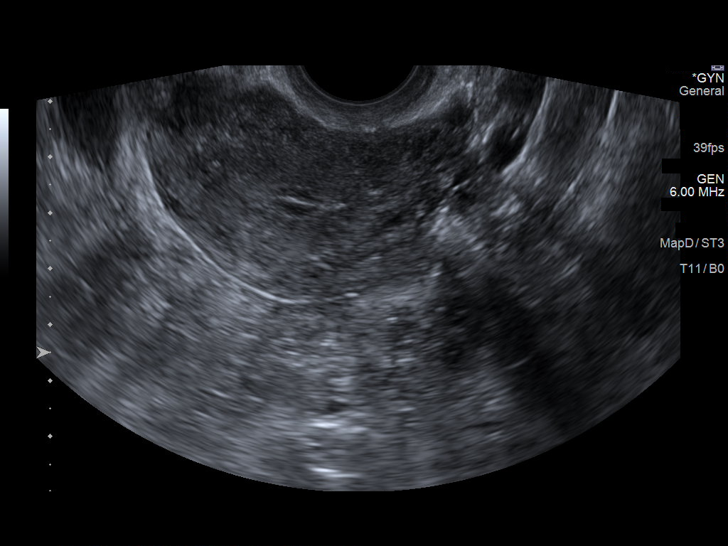
[im 18/50]
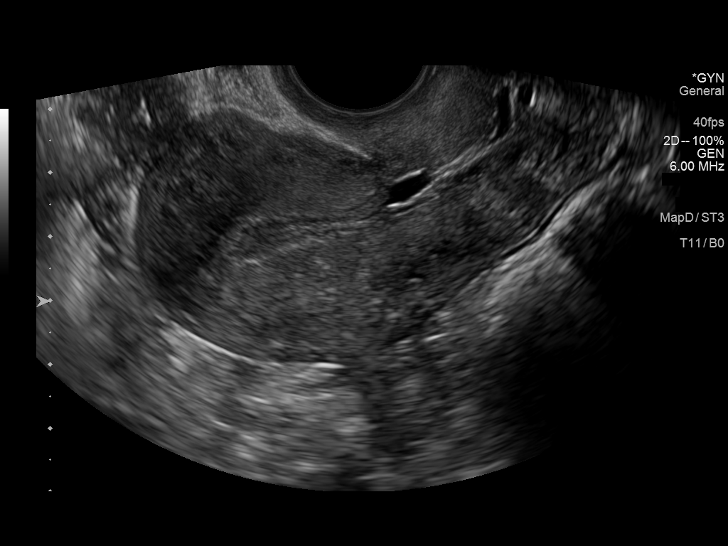
[im 22/50]
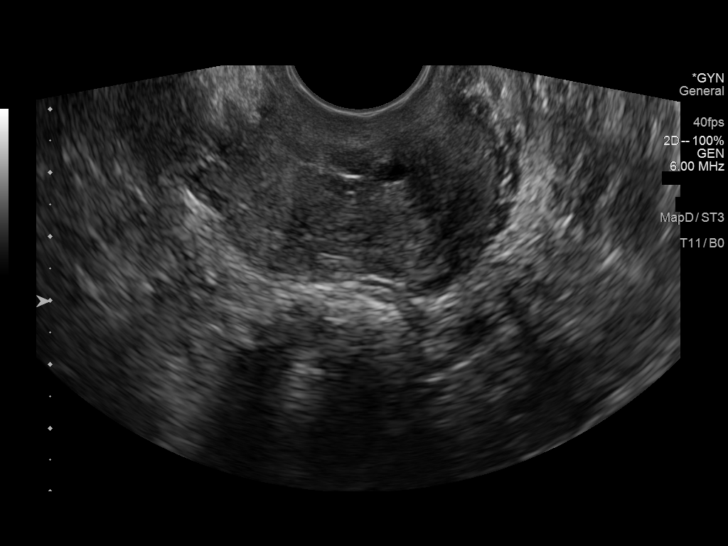
[im 26/50]
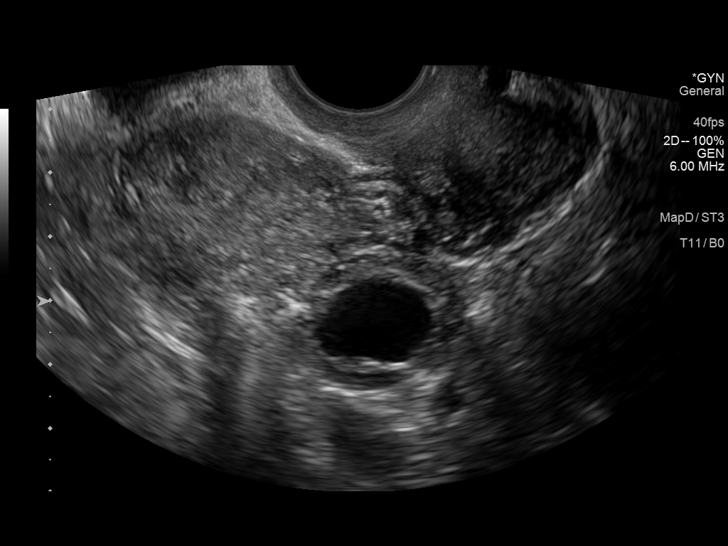
[im 30/50]
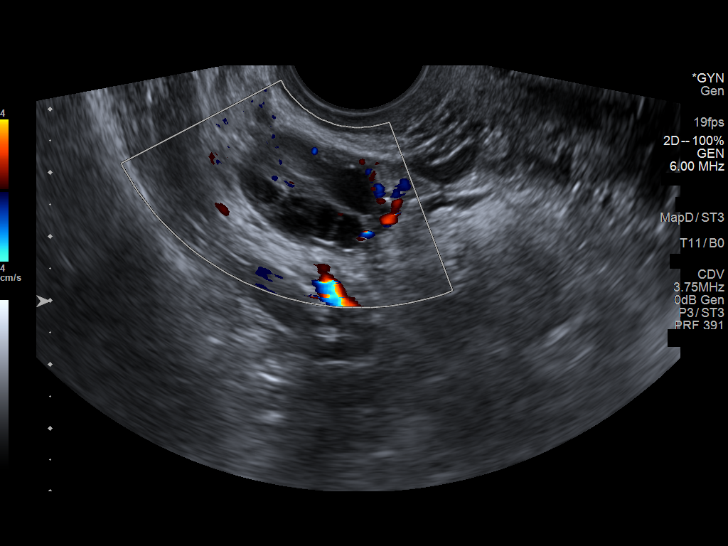
[im 35/50]
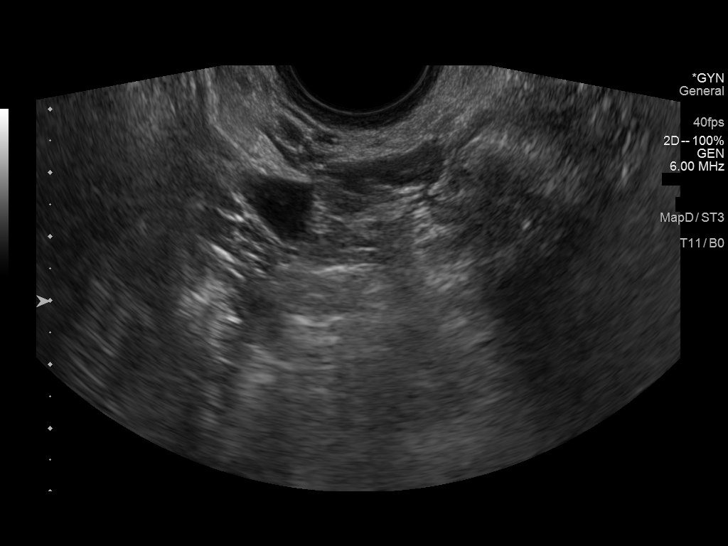
[im 39/50]
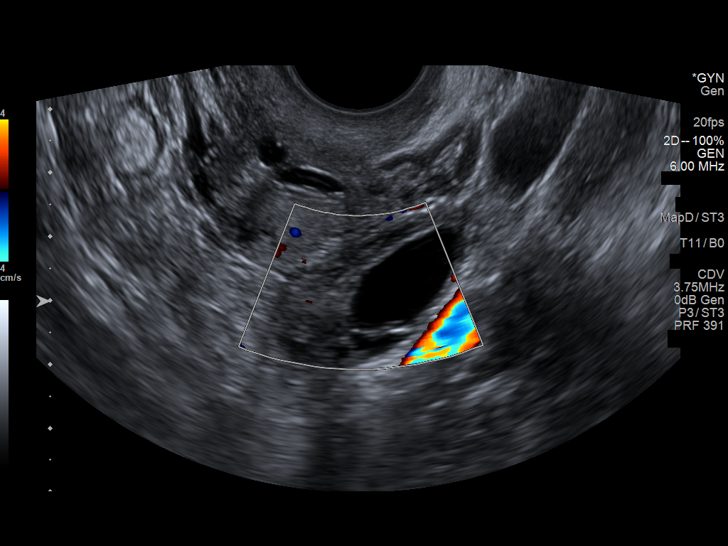
[im 43/50]
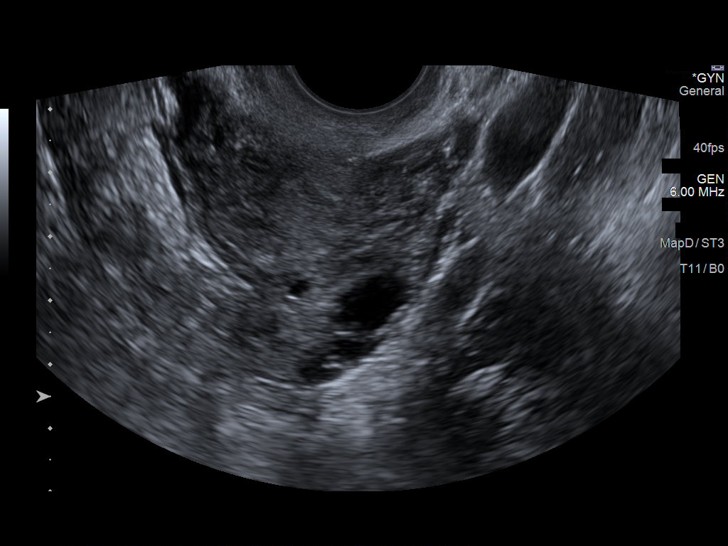
[im 47/50]
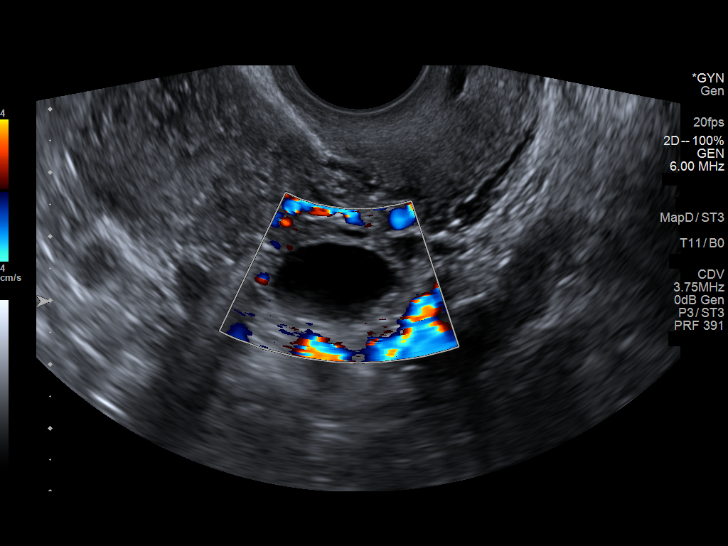

[Series 1001: us pelvis complete with transvaginal · 0.13mm/px · 1 of 1 slices shown (2 of 2)]
[im 1/1]
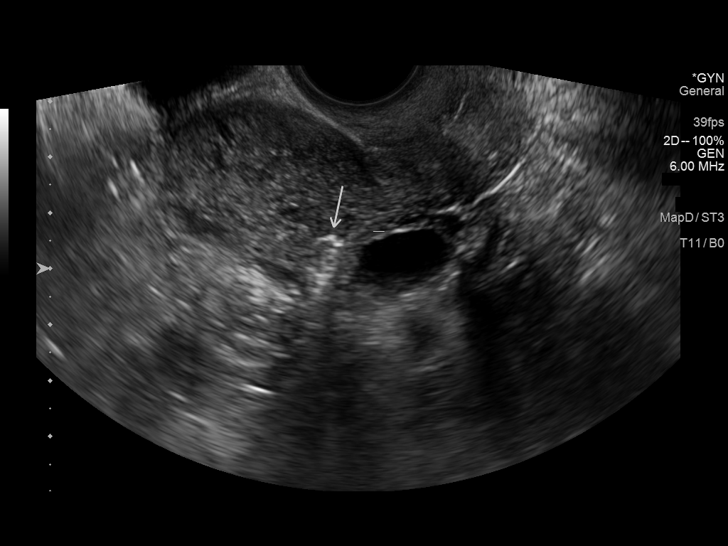

[13 of 25 positions shown; findings below may reference images not displayed]

FINDINGS: Uterus

Measurements: 9.4 x 3.2 x 5.4 cm = volume: 85 mL. Anteverted. Normal
morphology without mass. Nabothian cyst at cervix. Small peripheral
calcification LEFT lateral uterus.

Endometrium

Thickness: 8 mm. Small amount of endometrial fluid. No focal uterine
mass.

Right ovary

Measurements: 2.7 x 1.6 x 2.4 cm = volume: 5.2 mL. Normal morphology
without mass.

Left ovary

Measurements: 3.5 x 1.7 x 3.0 cm = volume: 9.0 mL. Dominant follicle
without mass

Other findings

No free pelvic fluid.  No adnexal masses.
IMPRESSION: Small amount of nonspecific endometrial fluid.

No additional pelvic sonographic abnormalities.

## 2020-07-25 DIAGNOSIS — F419 Anxiety disorder, unspecified: Secondary | ICD-10-CM

## 2020-07-25 DIAGNOSIS — F32A Depression, unspecified: Secondary | ICD-10-CM

## 2020-07-25 MED ORDER — BUPROPION HCL ER (XL) 150 MG PO TB24: ORAL_TABLET | ORAL | 1 refills | Status: DC

## 2020-08-29 ENCOUNTER — Other Ambulatory Visit: Payer: BC Managed Care – PPO

## 2020-08-31 ENCOUNTER — Other Ambulatory Visit: Payer: Self-pay

## 2020-08-31 ENCOUNTER — Other Ambulatory Visit (INDEPENDENT_AMBULATORY_CARE_PROVIDER_SITE_OTHER): Payer: BC Managed Care – PPO

## 2020-08-31 DIAGNOSIS — R7989 Other specified abnormal findings of blood chemistry: Secondary | ICD-10-CM

## 2020-08-31 DIAGNOSIS — Z79899 Other long term (current) drug therapy: Secondary | ICD-10-CM | POA: Diagnosis not present

## 2020-08-31 DIAGNOSIS — R7303 Prediabetes: Secondary | ICD-10-CM

## 2020-08-31 DIAGNOSIS — Z13220 Encounter for screening for lipoid disorders: Secondary | ICD-10-CM | POA: Diagnosis not present

## 2020-08-31 LAB — CBC WITH DIFFERENTIAL/PLATELET
Basophils Absolute: 0.1 10*3/uL (ref 0.0–0.1)
Basophils Relative: 1.1 % (ref 0.0–3.0)
Eosinophils Absolute: 0.1 10*3/uL (ref 0.0–0.7)
Eosinophils Relative: 2.1 % (ref 0.0–5.0)
HCT: 35.4 % — ABNORMAL LOW (ref 36.0–46.0)
Hemoglobin: 11.7 g/dL — ABNORMAL LOW (ref 12.0–15.0)
Lymphocytes Relative: 49.2 % — ABNORMAL HIGH (ref 12.0–46.0)
Lymphs Abs: 2.4 10*3/uL (ref 0.7–4.0)
MCHC: 33.2 g/dL (ref 30.0–36.0)
MCV: 84.5 fl (ref 78.0–100.0)
Monocytes Absolute: 0.3 10*3/uL (ref 0.1–1.0)
Monocytes Relative: 6.9 % (ref 3.0–12.0)
Neutro Abs: 2 10*3/uL (ref 1.4–7.7)
Neutrophils Relative %: 40.7 % — ABNORMAL LOW (ref 43.0–77.0)
Platelets: 310 10*3/uL (ref 150.0–400.0)
RBC: 4.19 Mil/uL (ref 3.87–5.11)
RDW: 12.8 % (ref 11.5–15.5)
WBC: 4.9 10*3/uL (ref 4.0–10.5)

## 2020-08-31 LAB — LIPID PANEL
Cholesterol: 207 mg/dL — ABNORMAL HIGH (ref 0–200)
HDL: 46.6 mg/dL (ref >39.00)
LDL Cholesterol: 127 mg/dL — ABNORMAL HIGH (ref 0–99)
NonHDL: 160.37
Total CHOL/HDL Ratio: 4
Triglycerides: 166 mg/dL — ABNORMAL HIGH (ref 0.0–149.0)
VLDL: 33.2 mg/dL (ref 0.0–40.0)

## 2020-08-31 LAB — VITAMIN D 25 HYDROXY (VIT D DEFICIENCY, FRACTURES): VITD: 28.12 ng/mL — ABNORMAL LOW (ref 30.00–100.00)

## 2020-08-31 LAB — TSH: TSH: 0.83 u[IU]/mL (ref 0.35–5.50)

## 2020-08-31 LAB — HEPATIC FUNCTION PANEL
ALT: 15 U/L (ref 0–35)
AST: 17 U/L (ref 0–37)
Albumin: 4.1 g/dL (ref 3.5–5.2)
Alkaline Phosphatase: 62 U/L (ref 39–117)
Bilirubin, Direct: 0.1 mg/dL (ref 0.0–0.3)
Total Bilirubin: 0.4 mg/dL (ref 0.2–1.2)
Total Protein: 6.9 g/dL (ref 6.0–8.3)

## 2020-08-31 LAB — BASIC METABOLIC PANEL
BUN: 10 mg/dL (ref 6–23)
CO2: 28 mEq/L (ref 19–32)
Calcium: 9.6 mg/dL (ref 8.4–10.5)
Chloride: 102 mEq/L (ref 96–112)
Creatinine, Ser: 0.87 mg/dL (ref 0.40–1.20)
GFR: 82.97 mL/min (ref >60.00)
Glucose, Bld: 95 mg/dL (ref 70–99)
Potassium: 4.2 mEq/L (ref 3.5–5.1)
Sodium: 137 mEq/L (ref 135–145)

## 2020-08-31 LAB — HEMOGLOBIN A1C: Hgb A1c MFr Bld: 6.2 % (ref 4.6–6.5)

## 2020-09-05 ENCOUNTER — Encounter: Payer: BC Managed Care – PPO | Admitting: Family Medicine

## 2020-09-07 ENCOUNTER — Other Ambulatory Visit: Payer: Self-pay | Admitting: Family Medicine

## 2020-09-07 DIAGNOSIS — Z1231 Encounter for screening mammogram for malignant neoplasm of breast: Secondary | ICD-10-CM

## 2020-09-08 ENCOUNTER — Other Ambulatory Visit: Payer: Self-pay

## 2020-09-08 ENCOUNTER — Encounter: Payer: Self-pay | Admitting: Family Medicine

## 2020-09-08 ENCOUNTER — Ambulatory Visit (INDEPENDENT_AMBULATORY_CARE_PROVIDER_SITE_OTHER): Payer: BC Managed Care – PPO | Admitting: Family Medicine

## 2020-09-08 VITALS — BP 110/80 | HR 84 | Temp 98.6°F | Ht 62.5 in | Wt 158.5 lb

## 2020-09-08 DIAGNOSIS — E041 Nontoxic single thyroid nodule: Secondary | ICD-10-CM

## 2020-09-08 DIAGNOSIS — Z0000 Encounter for general adult medical examination without abnormal findings: Secondary | ICD-10-CM

## 2020-09-08 NOTE — Progress Notes (Signed)
Octa Uplinger T. Virlee Stroschein, MD, CAQ Sports Medicine [facility] at [facility] [address] [place], [address]  Phone: [phone]  FAX: [phone]  Tina Schmidt - 40 y.o. female  MRN [id number]  Date of Birth: Oct 10, 1980  Date: 09/08/2020  PCP: Emi Belfast, FNP  Referral: Emi Belfast, FNP  Chief Complaint  Patient presents with   Annual Exam    This visit occurred during the SARS-CoV-2 public health emergency.  Safety protocols were in place, including screening questions prior to the visit, additional usage of staff PPE, and extensive cleaning of exam room while observing appropriate contact time as indicated for disinfecting solutions.   Patient Care Team: Emi Belfast, FNP as PCP - General (Nurse Practitioner) Subjective:   Tina Schmidt is a 40 y.o. pleasant patient who presents with the following:  Health Maintenance Summary Reviewed and updated, unless pt declines services.  Tobacco History Reviewed. Non-smoker Alcohol: No concerns, no excessive use Exercise Habits: Some activity, rec at least 30 mins 5 times a week STD concerns: none Drug Use: None Lumps or breast concerns: no  Tdap - check at flu Flu at work  Mammo scheduled  Knots  Thyroid - nodule -  present. - not getting bigger. It has been there for a number of years.  Health Maintenance  Topic Date Due   Pneumococcal Vaccine 19-34 Years old (1 - PCV) Never done   HIV Screening  Never done   Hepatitis C Screening  Never done   TETANUS/TDAP  Never done   COVID-19 Vaccine (3 - Pfizer risk series) 04/03/2019   INFLUENZA VACCINE  08/15/2020   PAP SMEAR-Modifier  02/22/2022   COLONOSCOPY (Pts 45-67yrs Insurance coverage will need to be confirmed)  02/05/2026   HPV VACCINES  Aged Out    Immunization History  Administered Date(s) Administered   Influenza,inj,Quad PF,6+ Mos 11/01/2017, 10/01/2018   Influenza-Unspecified 11/30/2016, 11/01/2017    PFIZER(Purple Top)SARS-COV-2 Vaccination 02/13/2019, 03/06/2019   Patient Active Problem List   Diagnosis Date Noted   Hirsutism 06/10/2019   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    Past Medical History:  Diagnosis Date   Anxiety    Depression    Dry eyes, bilateral    Gallstones    GERD (gastroesophageal reflux disease)    Hypercholesterolemia    Hypertension     Past Surgical History:  Procedure Laterality Date   CESAREAN SECTION     07/2004, 11/2007, 08/2012   CHOLECYSTECTOMY     REFRACTIVE SURGERY Bilateral    TUBAL LIGATION  08/26/2012    Family History  Problem Relation Age of Onset   Asthma Mother    Hyperlipidemia Mother    Hypertension Mother    Irritable bowel syndrome Mother    Hypertension Father    Liver cancer Maternal Grandmother    Diabetes Paternal Grandmother    Colon cancer Neg Hx    Esophageal cancer Neg Hx    Rectal cancer Neg Hx    Breast cancer Neg Hx     Past Medical History, Surgical History, Social History, Family History, Problem List, Medications, and Allergies have been reviewed and updated if relevant.  Review of Systems: Pertinent positives are listed above.  Otherwise, a full 14 point review of systems has been done in full and it is negative except where it is noted positive.  Objective:   BP 110/80   Pulse 84   Temp 98.6 F (37 C) (Temporal)   Ht 5' 2.5" (1.588 m)  Wt 158 lb 8 oz (71.9 kg)   LMP 08/23/2020   SpO2 97%   BMI 28.53 kg/m  Ideal Body Weight: Weight in (lb) to have BMI = 25: 138.6 No results found. Depression screen [facility] 2/[facility] 12/23/2019 09/04/2019 12/28/2016  Decreased Interest 0 0 0  Down, Depressed, Hopeless 0 0 0  PHQ - 2 Score 0 0 0  Altered sleeping - 1 -  Tired, decreased energy - 1 -  Change in appetite - 0 -  Feeling bad or failure about yourself  - 0 -  Trouble concentrating - 0 -  Moving slowly or fidgety/restless - 0 -  Suicidal thoughts - 0 -  PHQ-9 Score - 2 -  Difficult doing work/chores -  Not difficult at all -     GEN: well developed, well nourished, no acute distress Eyes: conjunctiva and lids normal, PERRLA, EOMI ENT: TM clear, nares clear, oral exam WNL Neck: There is a palpable thyroid nodule approximately the size of a 1 cm.  She also has some very small lymphadenopathy on the right side of the neck.   Pulm: clear to auscultation and percussion, respiratory effort normal CV: regular rate and rhythm, S1-S2, no murmur, rub or gallop, no bruits Chest: no scars, masses, no lumps BREAST: breast exam declined GI: soft, non-tender; no hepatosplenomegaly, masses; active bowel sounds all quadrants GU: GU exam declined Lymph: no cervical, axillary or inguinal adenopathy MSK: gait normal, muscle tone and strength WNL, no joint swelling, effusions, discoloration, crepitus  SKIN: clear, good turgor, color WNL, no rashes, lesions, or ulcerations Neuro: normal mental status, normal strength, sensation, and motion Psych: alert; oriented to person, place and time, normally interactive and not anxious or depressed in appearance.   All labs reviewed with patient. Results for orders placed or performed in visit on 08/31/20  Lipid panel  Result Value Ref Range   Cholesterol 207 (H) 0 - 200 mg/dL   Triglycerides 161.0166.0 (H) 0.0 - 149.0 mg/dL   HDL 96.0446.60 >54.09>39.00 mg/dL   VLDL 81.133.2 0.0 - 91.440.0 mg/dL   LDL Cholesterol 782127 (H) 0 - 99 mg/dL   Total CHOL/HDL Ratio 4    NonHDL 160.37   Hemoglobin A1c  Result Value Ref Range   Hgb A1c MFr Bld 6.2 4.6 - 6.5 %  CBC with Differential/Platelet  Result Value Ref Range   WBC 4.9 4.0 - 10.5 K/uL   RBC 4.19 3.87 - 5.11 Mil/uL   Hemoglobin 11.7 (L) 12.0 - 15.0 g/dL   HCT 95.635.4 (L) 21.336.0 - 08.646.0 %   MCV 84.5 78.0 - 100.0 fl   MCHC 33.2 30.0 - 36.0 g/dL   RDW 57.812.8 46.911.5 - 62.915.5 %   Platelets 310.0 150.0 - 400.0 K/uL   Neutrophils Relative % 40.7 (L) 43.0 - 77.0 %   Lymphocytes Relative 49.2 (H) 12.0 - 46.0 %   Monocytes Relative 6.9 3.0 - 12.0 %    Eosinophils Relative 2.1 0.0 - 5.0 %   Basophils Relative 1.1 0.0 - 3.0 %   Neutro Abs 2.0 1.4 - 7.7 K/uL   Lymphs Abs 2.4 0.7 - 4.0 K/uL   Monocytes Absolute 0.3 0.1 - 1.0 K/uL   Eosinophils Absolute 0.1 0.0 - 0.7 K/uL   Basophils Absolute 0.1 0.0 - 0.1 K/uL  Hepatic function panel  Result Value Ref Range   Total Bilirubin 0.4 0.2 - 1.2 mg/dL   Bilirubin, Direct 0.1 0.0 - 0.3 mg/dL   Alkaline Phosphatase 62 39 - 117 U/L  AST 17 0 - 37 U/L   ALT 15 0 - 35 U/L   Total Protein 6.9 6.0 - 8.3 g/dL   Albumin 4.1 3.5 - 5.2 g/dL  Basic metabolic panel  Result Value Ref Range   Sodium 137 135 - 145 mEq/L   Potassium 4.2 3.5 - 5.1 mEq/L   Chloride 102 96 - 112 mEq/L   CO2 28 19 - 32 mEq/L   Glucose, Bld 95 70 - 99 mg/dL   BUN 10 6 - 23 mg/dL   Creatinine, Ser 1.01 0.40 - 1.20 mg/dL   GFR 75.10 >25.85 mL/min   Calcium 9.6 8.4 - 10.5 mg/dL  TSH  Result Value Ref Range   TSH 0.83 0.35 - 5.50 uIU/mL  VITAMIN D 25 Hydroxy (Vit-D Deficiency, Fractures)  Result Value Ref Range   VITD 28.12 (L) 30.00 - 100.00 ng/mL   No results found.  Assessment and Plan:     ICD-10-CM   1. Healthcare maintenance  Z00.00     2. Thyroid nodule  E04.1 US THYROID     Overall, I think that she is doing well.  I encouraged healthy eating, physical activity, and weight loss. 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  With a palpable thyroid nodule, ultrasound to assess.  Health Maintenance Exam: The patient's preventative maintenance and recommended screening tests for an annual wellness exam were reviewed in full today. Brought up to date unless services declined.  Counselled on the importance of diet, exercise, and its role in overall health and mortality. The patient's FH and SH was reviewed, including their home life, tobacco status, and drug and alcohol status.  Follow-up in 1 year for physical exam or  additional follow-up below.  Follow-up: No follow-ups on file. Or follow-up in 1 year if not noted.  Future Appointments  Date Time Provider Department Center  10/18/2020  4:40 PM [facility]  11/15/2020  8:20 AM Eden Emms, NP [facility]    No orders of the defined types were placed in this encounter.  Medications Discontinued During This Encounter  Medication Reason   ciprofloxacin (CIPRO) 500 MG tablet Completed Course   fluconazole (DIFLUCAN) 150 MG tablet Completed Course   0.9 %  sodium chloride infusion Error   Orders Placed This Encounter  Procedures   US THYROID    Signed,  Deklynn Charlet T. Cassanda Walmer, MD   Allergies as of 09/08/2020   No Known Allergies      Medication List        Accurate as of September 08, 2020  2:00 PM. If you have any questions, ask your nurse or doctor.          STOP taking these medications    ciprofloxacin 500 MG tablet Commonly known as: Cipro Stopped by: Hannah Beat, MD   fluconazole 150 MG tablet Commonly known as: DIFLUCAN Stopped by: Hannah Beat, MD       TAKE these medications    buPROPion 150 MG 24 hr tablet Commonly known as: WELLBUTRIN XL TAKE 1 TABLET(150 MG) BY MOUTH DAILY   cyclobenzaprine 10 MG tablet Commonly known as: FLEXERIL Take 1 tablet (10 mg total) by mouth 3 (three) times daily as needed for muscle spasms.   famotidine 20 MG tablet Commonly known as: PEPCID Take 1 tablet (20 mg total) by mouth 2 (two) times daily.   ketoconazole 2 %  shampoo Commonly known as: NIZORAL Apply 1 application topically as needed.   nystatin cream Commonly known as: MYCOSTATIN APPLY EXTERNALLY TO THE AFFECTED AREA TWICE DAILY

## 2020-09-08 NOTE — Patient Instructions (Addendum)
Increase Vitamin D to 4,000 units a day.  Iron (Ferrous Sulfate) 325 mg a day - if it constipates you, then ok to take every other day.

## 2020-09-27 ENCOUNTER — Ambulatory Visit: Payer: BC Managed Care – PPO

## 2020-10-03 ENCOUNTER — Other Ambulatory Visit: Payer: Self-pay

## 2020-10-03 ENCOUNTER — Ambulatory Visit
Admission: RE | Admit: 2020-10-03 | Discharge: 2020-10-03 | Disposition: A | Discharge status: Home / Self Care | Payer: BC Managed Care – PPO | Source: Ambulatory Visit | Attending: Family Medicine | Admitting: Family Medicine

## 2020-10-03 DIAGNOSIS — E041 Nontoxic single thyroid nodule: Secondary | ICD-10-CM | POA: Insufficient documentation

## 2020-10-03 DIAGNOSIS — E049 Nontoxic goiter, unspecified: Secondary | ICD-10-CM | POA: Diagnosis not present

## 2020-10-04 ENCOUNTER — Telehealth: Payer: Self-pay | Admitting: Family Medicine

## 2020-10-04 DIAGNOSIS — F32A Depression, unspecified: Secondary | ICD-10-CM

## 2020-10-04 DIAGNOSIS — F419 Anxiety disorder, unspecified: Secondary | ICD-10-CM

## 2020-10-04 MED ORDER — BUPROPION HCL ER (XL) 150 MG PO TB24: ORAL_TABLET | ORAL | 1 refills | Status: DC

## 2020-10-18 ENCOUNTER — Other Ambulatory Visit: Payer: Self-pay

## 2020-10-18 ENCOUNTER — Ambulatory Visit
Admission: RE | Admit: 2020-10-18 | Discharge: 2020-10-18 | Disposition: A | Discharge status: Home / Self Care | Payer: BC Managed Care – PPO | Source: Ambulatory Visit | Attending: Family Medicine | Admitting: Family Medicine

## 2020-10-18 DIAGNOSIS — Z1231 Encounter for screening mammogram for malignant neoplasm of breast: Secondary | ICD-10-CM | POA: Insufficient documentation

## 2020-11-15 ENCOUNTER — Encounter: Payer: BC Managed Care – PPO | Admitting: Nurse Practitioner

## 2020-12-15 ENCOUNTER — Encounter: Payer: Self-pay | Admitting: Family Medicine

## 2020-12-15 NOTE — Telephone Encounter (Signed)
Called Tina Schmidt and cancelled her apt with Dr. Patsy Lager and made a TOC with [place]

## 2020-12-19 ENCOUNTER — Encounter: Payer: Self-pay | Admitting: Family

## 2020-12-19 ENCOUNTER — Ambulatory Visit: Payer: BC Managed Care – PPO | Admitting: Family Medicine

## 2020-12-19 ENCOUNTER — Ambulatory Visit: Payer: BC Managed Care – PPO | Admitting: Family

## 2020-12-19 ENCOUNTER — Other Ambulatory Visit: Payer: Self-pay

## 2020-12-19 VITALS — BP 138/86 | HR 81 | Temp 97.6°F | Ht 62.5 in | Wt 157.0 lb

## 2020-12-19 DIAGNOSIS — F419 Anxiety disorder, unspecified: Secondary | ICD-10-CM

## 2020-12-19 DIAGNOSIS — D509 Iron deficiency anemia, unspecified: Secondary | ICD-10-CM

## 2020-12-19 DIAGNOSIS — E7800 Pure hypercholesterolemia, unspecified: Secondary | ICD-10-CM | POA: Diagnosis not present

## 2020-12-19 DIAGNOSIS — L219 Seborrheic dermatitis, unspecified: Secondary | ICD-10-CM

## 2020-12-19 DIAGNOSIS — L680 Hirsutism: Secondary | ICD-10-CM

## 2020-12-19 DIAGNOSIS — E559 Vitamin D deficiency, unspecified: Secondary | ICD-10-CM

## 2020-12-19 DIAGNOSIS — B372 Candidiasis of skin and nail: Secondary | ICD-10-CM

## 2020-12-19 DIAGNOSIS — L304 Erythema intertrigo: Secondary | ICD-10-CM

## 2020-12-19 DIAGNOSIS — K219 Gastro-esophageal reflux disease without esophagitis: Secondary | ICD-10-CM

## 2020-12-19 DIAGNOSIS — Z87828 Personal history of other (healed) physical injury and trauma: Secondary | ICD-10-CM

## 2020-12-19 MED ORDER — NYSTATIN 100000 UNIT/GM EX CREA: TOPICAL_CREAM | CUTANEOUS | 1 refills | Status: DC
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
## 2020-12-19 NOTE — Assessment & Plan Note (Signed)
Advised to follow low-cholesterol diet and exercise as tolerated we will repeat labs, pending results

## 2020-12-19 NOTE — Assessment & Plan Note (Signed)
Continue follow-up with [facility].  Follow-up with Derm if necessary

## 2020-12-19 NOTE — Assessment & Plan Note (Signed)
Continue follow-up with dermatologist as scheduled continue Nizoral as needed

## 2020-12-19 NOTE — Patient Instructions (Signed)
I have sent out an order for labs that you can complete in the future, please schedule lab appt.   Please let me know when your last tetanus/TDAP was, and we will move forward with if it needs to be given or not.   You are due for your COVID vaccine, please schedule at pharmacy.   It was a pleasure seeing you today! Please do not hesitate to reach out with any questions and or concerns.  Regards,   Mort Sawyers

## 2020-12-19 NOTE — Assessment & Plan Note (Signed)
Refill sent to pharmacy.  Patient also given recommendation for body glide for her that she can purchase over-the-counter Which may be helpful for sweating.

## 2020-12-19 NOTE — Assessment & Plan Note (Signed)
Continue daily iron supplement, ordered lab work which is pending results

## 2020-12-19 NOTE — Assessment & Plan Note (Signed)
Discussed with patient weaning as she is taking 150 mg once per day when she is ready she can just discontinue.  Patient to follow-up and/or restart therapy if she has side effects that are less than desired and/or anxiety returns.  Patient then to follow-up if this occurs.

## 2020-12-19 NOTE — Assessment & Plan Note (Signed)
Continue daily vitamin D supplement, labs ordered pending results.

## 2021-01-17 ENCOUNTER — Encounter: Payer: Self-pay | Admitting: Family

## 2021-01-17 DIAGNOSIS — R0602 Shortness of breath: Secondary | ICD-10-CM | POA: Diagnosis not present

## 2021-01-17 DIAGNOSIS — R079 Chest pain, unspecified: Secondary | ICD-10-CM | POA: Diagnosis not present

## 2021-01-17 NOTE — Telephone Encounter (Signed)
I spoke with pt; for last 2 -3 wks pt has had fast heart beat. Pt was at work this morning at [facility] and had a full visit there with EKG; pt said was normal sinus rhythm and heart rate was 88. Initially pt had Higher BP 148/109 but pt retook BP this afternoon and at 2:15 PM BP 115/74. Pt will cb if needing an appt for FU. Pt also wanted to let Tabitha know that she restarted Bupropion 150 mg this morning and has decided to restart Bupropion. Sending note to Mort Sawyers FNP and [facility] CMA.

## 2021-01-18 ENCOUNTER — Other Ambulatory Visit: Payer: Self-pay | Admitting: Family

## 2021-01-18 ENCOUNTER — Encounter: Payer: Self-pay | Admitting: Family

## 2021-01-18 DIAGNOSIS — R739 Hyperglycemia, unspecified: Secondary | ICD-10-CM

## 2021-01-18 DIAGNOSIS — R002 Palpitations: Secondary | ICD-10-CM

## 2021-01-19 ENCOUNTER — Other Ambulatory Visit (INDEPENDENT_AMBULATORY_CARE_PROVIDER_SITE_OTHER): Payer: BLUE CROSS/BLUE SHIELD

## 2021-01-19 ENCOUNTER — Other Ambulatory Visit: Payer: Self-pay

## 2021-01-19 DIAGNOSIS — E559 Vitamin D deficiency, unspecified: Secondary | ICD-10-CM

## 2021-01-19 DIAGNOSIS — E7800 Pure hypercholesterolemia, unspecified: Secondary | ICD-10-CM

## 2021-01-19 DIAGNOSIS — R739 Hyperglycemia, unspecified: Secondary | ICD-10-CM | POA: Diagnosis not present

## 2021-01-19 DIAGNOSIS — R002 Palpitations: Secondary | ICD-10-CM

## 2021-01-19 DIAGNOSIS — D509 Iron deficiency anemia, unspecified: Secondary | ICD-10-CM | POA: Diagnosis not present

## 2021-01-19 LAB — FERRITIN: Ferritin: 68.1 ng/mL (ref 10.0–291.0)

## 2021-01-19 LAB — CBC WITH DIFFERENTIAL/PLATELET
Basophils Absolute: 0 10*3/uL (ref 0.0–0.1)
Basophils Relative: 0.7 % (ref 0.0–3.0)
Eosinophils Absolute: 0.1 10*3/uL (ref 0.0–0.7)
Eosinophils Relative: 1.9 % (ref 0.0–5.0)
HCT: 39.9 % (ref 36.0–46.0)
Hemoglobin: 12.8 g/dL (ref 12.0–15.0)
Lymphocytes Relative: 43 % (ref 12.0–46.0)
Lymphs Abs: 2.7 10*3/uL (ref 0.7–4.0)
MCHC: 32 g/dL (ref 30.0–36.0)
MCV: 86.6 fl (ref 78.0–100.0)
Monocytes Absolute: 0.5 10*3/uL (ref 0.1–1.0)
Monocytes Relative: 7.5 % (ref 3.0–12.0)
Neutro Abs: 3 10*3/uL (ref 1.4–7.7)
Neutrophils Relative %: 46.9 % (ref 43.0–77.0)
Platelets: 280 10*3/uL (ref 150.0–400.0)
RBC: 4.61 Mil/uL (ref 3.87–5.11)
RDW: 13.9 % (ref 11.5–15.5)
WBC: 6.3 10*3/uL (ref 4.0–10.5)

## 2021-01-19 LAB — LIPID PANEL
Cholesterol: 213 mg/dL — ABNORMAL HIGH (ref 0–200)
HDL: 48.8 mg/dL (ref >39.00)
LDL Cholesterol: 144 mg/dL — ABNORMAL HIGH (ref 0–99)
NonHDL: 164.21
Total CHOL/HDL Ratio: 4
Triglycerides: 100 mg/dL (ref 0.0–149.0)
VLDL: 20 mg/dL (ref 0.0–40.0)

## 2021-01-19 LAB — VITAMIN D 25 HYDROXY (VIT D DEFICIENCY, FRACTURES): VITD: 39.01 ng/mL (ref 30.00–100.00)

## 2021-01-19 LAB — HEMOGLOBIN A1C: Hgb A1c MFr Bld: 6 % (ref 4.6–6.5)

## 2021-01-19 LAB — TSH: TSH: 0.54 u[IU]/mL (ref 0.35–5.50)

## 2021-01-19 NOTE — Telephone Encounter (Signed)
Noted  

## 2021-01-20 ENCOUNTER — Other Ambulatory Visit: Payer: Self-pay | Admitting: Family

## 2021-01-20 ENCOUNTER — Encounter: Payer: Self-pay | Admitting: Family

## 2021-01-20 DIAGNOSIS — E7800 Pure hypercholesterolemia, unspecified: Secondary | ICD-10-CM

## 2021-01-20 DIAGNOSIS — E559 Vitamin D deficiency, unspecified: Secondary | ICD-10-CM

## 2021-01-20 LAB — IRON AND TIBC
Iron Saturation: 25 % (ref 15–55)
Iron: 103 ug/dL (ref 27–159)
Total Iron Binding Capacity: 413 ug/dL (ref 250–450)
UIBC: 310 ug/dL (ref 131–425)

## 2021-01-20 MED ORDER — ATORVASTATIN CALCIUM 10 MG PO TABS: 10.0000 mg | ORAL_TABLET | Freq: Every day | ORAL | 1 refills | Status: DC

## 2021-01-20 MED ORDER — VITAMIN D (ERGOCALCIFEROL) 1.25 MG (50000 UNIT) PO CAPS: 50000.0000 [IU] | ORAL_CAPSULE | ORAL | 0 refills | Status: AC

## 2021-02-02 ENCOUNTER — Encounter: Payer: Self-pay | Admitting: Family

## 2021-02-02 ENCOUNTER — Other Ambulatory Visit: Payer: Self-pay

## 2021-02-02 ENCOUNTER — Ambulatory Visit (INDEPENDENT_AMBULATORY_CARE_PROVIDER_SITE_OTHER): Payer: BC Managed Care – PPO | Admitting: Family

## 2021-02-02 VITALS — BP 118/52 | HR 80 | Temp 97.6°F | Ht 62.5 in | Wt 155.0 lb

## 2021-02-02 DIAGNOSIS — F419 Anxiety disorder, unspecified: Secondary | ICD-10-CM

## 2021-02-02 DIAGNOSIS — F411 Generalized anxiety disorder: Secondary | ICD-10-CM

## 2021-02-02 DIAGNOSIS — D509 Iron deficiency anemia, unspecified: Secondary | ICD-10-CM

## 2021-02-02 DIAGNOSIS — R12 Heartburn: Secondary | ICD-10-CM | POA: Insufficient documentation

## 2021-02-02 DIAGNOSIS — E7800 Pure hypercholesterolemia, unspecified: Secondary | ICD-10-CM

## 2021-02-02 MED ORDER — OMEPRAZOLE 20 MG PO CPDR
20.0000 mg | DELAYED_RELEASE_CAPSULE | Freq: Every day | ORAL | 0 refills | Status: DC
Start: 2021-02-02 — End: 2021-05-05

## 2021-02-02 MED ORDER — BUPROPION HCL ER (XL) 150 MG PO TB24: 150.0000 mg | ORAL_TABLET | Freq: Every day | ORAL | 1 refills | Status: DC

## 2021-02-02 NOTE — Assessment & Plan Note (Signed)
Trial ppi, omeprazole sent to pharmacy for 30 day period. Try to decrease and or avoid spicy foods, fried fatty foods, and also caffeine and chocolate as these can increase heartburn symptoms.

## 2021-02-02 NOTE — Assessment & Plan Note (Signed)
Refill sent in for Bupropion.  Discussed anxiety reducing technique, hand out given to patient.

## 2021-02-02 NOTE — Assessment & Plan Note (Signed)
Cbc stable will continue to monitor
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
## 2021-02-02 NOTE — Assessment & Plan Note (Signed)
Continue statin therapy. Work on low cholesterol diet and exercise as tolerated

## 2021-02-02 NOTE — Patient Instructions (Addendum)
Continue with bupropion 150 mg one po qd. Refill given as needed.  Discussed anxiety reducing techniques.   Continue with cholesterol medication and working on low cholesterol diet.   Work on low cholesterol diet and exercise as tolerated   It was a pleasure seeing you today! Please do not hesitate to reach out with any questions and or concerns.  Regards,   Mort Sawyers FNP-C

## 2021-03-03 ENCOUNTER — Other Ambulatory Visit: Payer: Self-pay | Admitting: Family

## 2021-03-03 DIAGNOSIS — R12 Heartburn: Secondary | ICD-10-CM

## 2021-03-13 ENCOUNTER — Ambulatory Visit (INDEPENDENT_AMBULATORY_CARE_PROVIDER_SITE_OTHER)
Admission: RE | Admit: 2021-03-13 | Discharge: 2021-03-13 | Disposition: A | Discharge status: Home / Self Care | Payer: BC Managed Care – PPO | Source: Ambulatory Visit | Attending: Family | Admitting: Family

## 2021-03-13 ENCOUNTER — Ambulatory Visit: Payer: BC Managed Care – PPO | Admitting: Family

## 2021-03-13 ENCOUNTER — Other Ambulatory Visit: Payer: Self-pay | Admitting: Family

## 2021-03-13 ENCOUNTER — Other Ambulatory Visit: Payer: Self-pay

## 2021-03-13 VITALS — BP 134/78 | HR 85 | Temp 99.0°F | Ht 62.5 in | Wt 157.0 lb

## 2021-03-13 DIAGNOSIS — M5020 Other cervical disc displacement, unspecified cervical region: Secondary | ICD-10-CM | POA: Insufficient documentation

## 2021-03-13 DIAGNOSIS — M2578 Osteophyte, vertebrae: Secondary | ICD-10-CM | POA: Diagnosis not present

## 2021-03-13 DIAGNOSIS — E559 Vitamin D deficiency, unspecified: Secondary | ICD-10-CM

## 2021-03-13 DIAGNOSIS — M50322 Other cervical disc degeneration at C5-C6 level: Secondary | ICD-10-CM

## 2021-03-13 DIAGNOSIS — M542 Cervicalgia: Secondary | ICD-10-CM

## 2021-03-13 DIAGNOSIS — M4802 Spinal stenosis, cervical region: Secondary | ICD-10-CM

## 2021-03-13 MED ORDER — CYCLOBENZAPRINE HCL 10 MG PO TABS: 10.0000 mg | ORAL_TABLET | Freq: Three times a day (TID) | ORAL | 0 refills | Status: AC | PRN

## 2021-03-13 MED ORDER — HYDROCODONE-ACETAMINOPHEN 5-325 MG PO TABS: 1.0000 | ORAL_TABLET | Freq: Four times a day (QID) | ORAL | 0 refills | Status: AC | PRN

## 2021-03-13 NOTE — Patient Instructions (Addendum)
A referral was placed today for neurosurgery Please let us know if you have not heard back within 1 week about your referral. I have sent some pain medication to your pharmacy as well as a muscle relaxer to hopefully give you some relief.   It was a pleasure seeing you today! Please do not hesitate to reach out with any questions and or concerns.  Regards,   Eugenia Pancoast FNP-C
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
## 2021-03-13 NOTE — Assessment & Plan Note (Signed)
Flexeril 10 mg sent to patient advised this may make her sleepy Also due to 10 out of 10 pain and difficult range of motion have sent pain medication Norco, PDMP reviewed and no suspicious activity.  Patient advised not to take with any other alcohol or sedative medications.  If any worsening of symptoms patient to go to urgent care or [facility].  Stat referral for neurosurgeon pending referrals, suspected herniated or bulging disc.  Heat and ice as necessary.  Work note for the next 2 days for some rest

## 2021-03-13 NOTE — Assessment & Plan Note (Signed)
History of this in the past however no records at current ordering x-ray of the neck today

## 2021-03-14 ENCOUNTER — Encounter: Payer: Self-pay | Admitting: Family

## 2021-03-20 ENCOUNTER — Encounter: Payer: Self-pay | Admitting: *Deleted

## 2021-03-23 DIAGNOSIS — M542 Cervicalgia: Secondary | ICD-10-CM | POA: Insufficient documentation

## 2021-03-23 DIAGNOSIS — R200 Anesthesia of skin: Secondary | ICD-10-CM | POA: Diagnosis not present

## 2021-03-23 DIAGNOSIS — R202 Paresthesia of skin: Secondary | ICD-10-CM | POA: Diagnosis not present

## 2021-03-23 DIAGNOSIS — M5412 Radiculopathy, cervical region: Secondary | ICD-10-CM | POA: Diagnosis not present

## 2021-03-29 ENCOUNTER — Other Ambulatory Visit: Payer: Self-pay | Admitting: Family

## 2021-03-31 MED ORDER — KETOCONAZOLE 2 % EX SHAM: 1.0000 "application " | MEDICATED_SHAMPOO | CUTANEOUS | 2 refills | Status: AC

## 2021-04-17 ENCOUNTER — Other Ambulatory Visit: Payer: Self-pay

## 2021-04-17 DIAGNOSIS — F419 Anxiety disorder, unspecified: Secondary | ICD-10-CM

## 2021-04-17 MED ORDER — BUPROPION HCL ER (XL) 150 MG PO TB24: 150.0000 mg | ORAL_TABLET | Freq: Every day | ORAL | 1 refills | Status: DC

## 2021-04-20 ENCOUNTER — Other Ambulatory Visit (INDEPENDENT_AMBULATORY_CARE_PROVIDER_SITE_OTHER): Payer: BC Managed Care – PPO

## 2021-04-20 ENCOUNTER — Other Ambulatory Visit: Payer: BC Managed Care – PPO

## 2021-04-20 DIAGNOSIS — Z79899 Other long term (current) drug therapy: Secondary | ICD-10-CM | POA: Diagnosis not present

## 2021-04-20 DIAGNOSIS — E7800 Pure hypercholesterolemia, unspecified: Secondary | ICD-10-CM

## 2021-04-21 LAB — LIPID PANEL
Cholesterol: 195 mg/dL (ref <200)
HDL: 57 mg/dL (ref >=50)
LDL Cholesterol (Calc): 109 mg/dL (calc) — ABNORMAL HIGH
Non-HDL Cholesterol (Calc): 138 mg/dL (calc) — ABNORMAL HIGH (ref <130)
Total CHOL/HDL Ratio: 3.4 (calc) (ref <5.0)
Triglycerides: 169 mg/dL — ABNORMAL HIGH (ref <150)

## 2021-04-21 LAB — HEPATIC FUNCTION PANEL
AG Ratio: 1.5 (calc) (ref 1.0–2.5)
ALT: 16 U/L (ref 6–29)
AST: 18 U/L (ref 10–30)
Albumin: 4.7 g/dL (ref 3.6–5.1)
Alkaline phosphatase (APISO): 70 U/L (ref 31–125)
Bilirubin, Direct: 0.1 mg/dL (ref 0.0–0.2)
Globulin: 3.1 g/dL (calc) (ref 1.9–3.7)
Indirect Bilirubin: 0.2 mg/dL (calc) (ref 0.2–1.2)
Total Bilirubin: 0.3 mg/dL (ref 0.2–1.2)
Total Protein: 7.8 g/dL (ref 6.1–8.1)

## 2021-04-28 ENCOUNTER — Ambulatory Visit: Payer: BC Managed Care – PPO | Admitting: Family

## 2021-04-30 ENCOUNTER — Encounter: Payer: Self-pay | Admitting: Family

## 2021-05-03 ENCOUNTER — Other Ambulatory Visit: Payer: BC Managed Care – PPO

## 2021-05-05 ENCOUNTER — Encounter: Payer: Self-pay | Admitting: Family

## 2021-05-05 ENCOUNTER — Ambulatory Visit: Payer: BC Managed Care – PPO | Admitting: Family

## 2021-05-05 VITALS — BP 134/72 | HR 89 | Temp 97.5°F | Resp 16 | Ht 62.5 in | Wt 162.0 lb

## 2021-05-05 DIAGNOSIS — N898 Other specified noninflammatory disorders of vagina: Secondary | ICD-10-CM

## 2021-05-05 DIAGNOSIS — M5020 Other cervical disc displacement, unspecified cervical region: Secondary | ICD-10-CM

## 2021-05-05 DIAGNOSIS — B3731 Acute candidiasis of vulva and vagina: Secondary | ICD-10-CM

## 2021-05-05 DIAGNOSIS — R350 Frequency of micturition: Secondary | ICD-10-CM | POA: Diagnosis not present

## 2021-05-05 MED ORDER — FLUCONAZOLE 150 MG PO TABS: ORAL_TABLET | ORAL | 0 refills | Status: DC

## 2021-05-05 NOTE — Assessment & Plan Note (Signed)
Sending rx medrol dose pack 4 mg ?Cont f/u with neurosurgery  ?
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
## 2021-05-05 NOTE — Assessment & Plan Note (Signed)
Treating off of symptoms, rx diflucan 150 mg ?Handout given ?

## 2021-05-05 NOTE — Assessment & Plan Note (Signed)
Ancillary urine ordered ?Pending results ?Continue vagisil cream prn ?

## 2021-05-05 NOTE — Patient Instructions (Signed)
Due to recent changes in healthcare laws, you may see results of your imaging and/or laboratory studies on MyChart before I have had a chance to review them.  I understand that in some cases there may be results that are confusing or concerning to you. Please understand that not all results are received at the same time and often I may need to interpret multiple results in order to provide you with the best plan of care or course of treatment. Therefore, I ask that you please give me 2 business days to thoroughly review all your results before contacting my office for clarification. Should we see a critical lab result, you will be contacted sooner.   It was a pleasure seeing you today! Please do not hesitate to reach out with any questions and or concerns.  Regards,   Relena Ivancic FNP-C  

## 2021-05-05 NOTE — Assessment & Plan Note (Signed)
Ordering urine culture ?Pending results ?

## 2021-05-06 LAB — URINE CULTURE
MICRO NUMBER:: 13296201
SPECIMEN QUALITY:: ADEQUATE

## 2021-05-08 ENCOUNTER — Encounter: Payer: Self-pay | Admitting: Family

## 2021-05-08 DIAGNOSIS — M542 Cervicalgia: Secondary | ICD-10-CM

## 2021-05-08 MED ORDER — METHYLPREDNISOLONE 4 MG PO TBPK: ORAL_TABLET | ORAL | 0 refills | Status: DC

## 2021-05-19 ENCOUNTER — Encounter: Payer: Self-pay | Admitting: Family

## 2021-05-22 MED ORDER — NYSTATIN 100000 UNIT/GM EX POWD: 1.0000 "application " | Freq: Three times a day (TID) | CUTANEOUS | 0 refills | Status: DC

## 2021-06-02 ENCOUNTER — Encounter: Payer: Self-pay | Admitting: Family

## 2021-06-02 ENCOUNTER — Ambulatory Visit: Payer: BC Managed Care – PPO | Admitting: Family

## 2021-06-02 VITALS — BP 110/76 | HR 91 | Temp 99.0°F | Resp 16 | Ht 62.5 in | Wt 158.0 lb

## 2021-06-02 DIAGNOSIS — N898 Other specified noninflammatory disorders of vagina: Secondary | ICD-10-CM | POA: Diagnosis not present

## 2021-06-02 DIAGNOSIS — K640 First degree hemorrhoids: Secondary | ICD-10-CM | POA: Diagnosis not present

## 2021-06-02 MED ORDER — METRONIDAZOLE 500 MG PO TABS: 500.0000 mg | ORAL_TABLET | Freq: Two times a day (BID) | ORAL | 0 refills | Status: AC

## 2021-06-02 MED ORDER — HYDROCORTISONE (PERIANAL) 2.5 % EX CREA: 1.0000 "application " | TOPICAL_CREAM | Freq: Two times a day (BID) | CUTANEOUS | 0 refills | Status: DC

## 2021-06-02 NOTE — Assessment & Plan Note (Signed)
Causing itching rx anusol 2% cream Apply to area Avoid straining/constipaton

## 2021-06-02 NOTE — Assessment & Plan Note (Addendum)
Suspected bacterial vaginitis Will choose to treat rx sent for flagyl 500 mg po bid x 7 days Do not drink etoh with this, pt advised  With pt consent pelvic exam completed in office Wet prep collected and sent for processing pending results
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
## 2021-06-05 LAB — WET PREP BY MOLECULAR PROBE
Candida species: NOT DETECTED
Gardnerella vaginalis: NOT DETECTED
MICRO NUMBER:: 13420535
SPECIMEN QUALITY:: ADEQUATE
Trichomonas vaginosis: NOT DETECTED

## 2021-06-06 ENCOUNTER — Encounter: Payer: Self-pay | Admitting: Family

## 2021-07-01 ENCOUNTER — Other Ambulatory Visit: Payer: Self-pay | Admitting: Family

## 2021-07-01 DIAGNOSIS — E7800 Pure hypercholesterolemia, unspecified: Secondary | ICD-10-CM

## 2021-07-05 ENCOUNTER — Other Ambulatory Visit: Payer: Self-pay | Admitting: Physician Assistant

## 2021-07-05 DIAGNOSIS — M5412 Radiculopathy, cervical region: Secondary | ICD-10-CM

## 2021-07-05 DIAGNOSIS — M542 Cervicalgia: Secondary | ICD-10-CM

## 2021-07-05 DIAGNOSIS — R200 Anesthesia of skin: Secondary | ICD-10-CM

## 2021-07-15 ENCOUNTER — Other Ambulatory Visit: Payer: BC Managed Care – PPO

## 2021-07-20 ENCOUNTER — Ambulatory Visit
Admission: RE | Admit: 2021-07-20 | Discharge: 2021-07-20 | Disposition: A | Discharge status: Home / Self Care | Payer: BC Managed Care – PPO | Source: Ambulatory Visit | Attending: Physician Assistant | Admitting: Physician Assistant

## 2021-07-20 DIAGNOSIS — R200 Anesthesia of skin: Secondary | ICD-10-CM | POA: Insufficient documentation

## 2021-07-20 DIAGNOSIS — R202 Paresthesia of skin: Secondary | ICD-10-CM | POA: Diagnosis not present

## 2021-07-20 DIAGNOSIS — M5412 Radiculopathy, cervical region: Secondary | ICD-10-CM | POA: Diagnosis not present

## 2021-07-20 DIAGNOSIS — M542 Cervicalgia: Secondary | ICD-10-CM | POA: Diagnosis not present

## 2021-08-04 DIAGNOSIS — M5030 Other cervical disc degeneration, unspecified cervical region: Secondary | ICD-10-CM | POA: Diagnosis not present

## 2021-08-04 DIAGNOSIS — M5412 Radiculopathy, cervical region: Secondary | ICD-10-CM | POA: Diagnosis not present

## 2021-09-19 ENCOUNTER — Other Ambulatory Visit: Payer: Self-pay | Admitting: Family

## 2021-09-19 DIAGNOSIS — Z1231 Encounter for screening mammogram for malignant neoplasm of breast: Secondary | ICD-10-CM

## 2021-09-22 ENCOUNTER — Ambulatory Visit: Payer: BC Managed Care – PPO | Admitting: Family

## 2021-09-29 ENCOUNTER — Other Ambulatory Visit: Payer: Self-pay | Admitting: Family

## 2021-09-29 DIAGNOSIS — F419 Anxiety disorder, unspecified: Secondary | ICD-10-CM

## 2021-10-02 ENCOUNTER — Encounter: Payer: Self-pay | Admitting: Family

## 2021-10-02 ENCOUNTER — Ambulatory Visit: Payer: BC Managed Care – PPO | Admitting: Family

## 2021-10-02 ENCOUNTER — Other Ambulatory Visit (HOSPITAL_COMMUNITY)
Admission: RE | Admit: 2021-10-02 | Discharge: 2021-10-02 | Disposition: A | Discharge status: Home / Self Care | Payer: BC Managed Care – PPO | Source: Ambulatory Visit | Attending: Family | Admitting: Family

## 2021-10-02 VITALS — BP 112/66 | HR 78 | Temp 98.6°F | Resp 16 | Ht 62.5 in | Wt 163.1 lb

## 2021-10-02 DIAGNOSIS — Z0000 Encounter for general adult medical examination without abnormal findings: Secondary | ICD-10-CM | POA: Diagnosis not present

## 2021-10-02 DIAGNOSIS — E663 Overweight: Secondary | ICD-10-CM

## 2021-10-02 DIAGNOSIS — Z124 Encounter for screening for malignant neoplasm of cervix: Secondary | ICD-10-CM | POA: Insufficient documentation

## 2021-10-02 DIAGNOSIS — Z23 Encounter for immunization: Secondary | ICD-10-CM | POA: Diagnosis not present

## 2021-10-02 DIAGNOSIS — R7303 Prediabetes: Secondary | ICD-10-CM | POA: Diagnosis not present

## 2021-10-02 DIAGNOSIS — Z01419 Encounter for gynecological examination (general) (routine) without abnormal findings: Secondary | ICD-10-CM

## 2021-10-02 LAB — POCT GLYCOSYLATED HEMOGLOBIN (HGB A1C): HbA1c, POC (prediabetic range): 5.7 % (ref 5.7–6.4)

## 2021-10-02 NOTE — Assessment & Plan Note (Signed)
Influenza vaccine administered in office Pt tolerated procedure well  Verbal consent obtained prior to administration  Handout given in regards to vaccination.    

## 2021-10-02 NOTE — Assessment & Plan Note (Signed)
Recommend pt to work on diet and exercise Recommended my fitness pal and or weight watchers Advised to incorporate exercise routines , goal of 3-5 days a week with 30 min sessions.

## 2021-10-02 NOTE — Assessment & Plan Note (Signed)
Repeat hga1c in office today with improvement from last visit.  Commended pt on efforts.  advised to continue on diabetic diet and exercise as tolerated.

## 2021-10-02 NOTE — Assessment & Plan Note (Signed)
Pt verbalized consent for pelvic exam Declines std testing hpv thin prep ordered and pending results Pap exam in office completed, pt tolerated well.   
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
## 2021-10-02 NOTE — Assessment & Plan Note (Signed)
Patient Counseling(The following topics were reviewed): ? Preventative care handout given to pt  ?Health maintenance and immunizations reviewed. Please refer to Health maintenance section. ?Pt advised on safe sex, wearing seatbelts in car, and proper nutrition ?labwork ordered today for annual ?Dental health: Discussed importance of regular tooth brushing, flossing, and dental visits. ? ? ?

## 2021-10-02 NOTE — Patient Instructions (Addendum)
Budget-Friendly Healthy Eating There are many ways to save money at the grocery store and continue to eat healthy. You can be successful if you: Plan meals according to your budget. Make a grocery list and only purchase food according to your grocery list. Prepare food yourself at home. What are tips for following this plan? Reading food labels Compare food labels between brand name foods and the store brand. Often the nutritional value is the same, but the store brand is lower cost. Look for products that do not have added sugar, fat, or salt (sodium). These often cost the same but are healthier for you. Products may be labeled as: Sugar-free. Nonfat. Low-fat. Sodium-free. Low-sodium. Look for lean ground beef labeled as at least 92% lean and 8% fat. Shopping  Buy only the items on your grocery list and go only to the areas of the store that have the items on your list. Use coupons only for foods and brands you normally buy. Avoid buying items you wouldn't normally buy simply because they are on sale. Check online and in newspapers for weekly deals. Buy healthy items from the bulk bins when available, such as herbs, spices, flour, pasta, nuts, and dried fruit. Buy fruits and vegetables that are in season. Prices are usually lower on in-season produce. Look at the unit price on the price tag. Use it to compare different brands and sizes to find out which item is the best deal. Choose healthy items that are often low-cost, such as carrots, potatoes, apples, bananas, and oranges. Dried or canned beans are a low-cost protein source. Buy in bulk and freeze extra food. Items you can buy in bulk include meats, fish, poultry, frozen fruits, and frozen vegetables. Avoid buying "ready-to-eat" foods, such as pre-cut fruits and vegetables and pre-made salads. If possible, shop around to discover where you can find the best prices. Consider other retailers such as dollar stores, larger wholesale  stores, local fruit and vegetable stands, and farmers markets. Do not shop when you are hungry. If you shop while hungry, it may be hard to stick to your list and budget. Resist impulse buying. Use your grocery list as your official plan for the week. Buy a variety of vegetables and fruits by purchasing fresh, frozen, and canned items. Look at the top and bottom shelves for deals. Foods at eye level (eye level of an adult or child) are usually more expensive. Be efficient with your time when shopping. The more time you spend at the store, the more money you are likely to spend. To save money when choosing more expensive foods like meats and dairy: Choose cheaper cuts of meat, such as bone-in chicken thighs and drumsticks instead of skinless and boneless chicken. When you are ready to prepare the chicken, you can remove the skin yourself to make it healthier. Choose lean meats like chicken or turkey instead of beef. Choose canned seafood, such as tuna, salmon, or sardines. Buy eggs as a low-cost source of protein. Buy dried beans and peas, such as lentils, split peas, or kidney beans instead of meats. Dried beans and peas are a good alternative source of protein. Buy the larger tubs of yogurt instead of individual-sized containers. Choose water instead of sodas and other sweetened beverages. Avoid buying chips, cookies, and other "junk food." These items are usually expensive and not healthy. Cooking Make extra food and freeze the extras in meal-sized containers or in individual portions for fast meals and snacks. Pre-cook on days when you have   extra time to prepare meals in advance. You can keep these meals in the fridge or freezer and reheat for a quick meal. When you come home from the grocery store, wash, peel, and cut fruits and vegetables so they are ready to use and eat. This will help reduce food waste. Meal planning Do not eat out or get fast food. Prepare food at home. Make a grocery  list and make sure to bring it with you to the store. If you have a smart phone, you could use your phone to create your shopping list. Plan meals and snacks according to a grocery list and budget you create. Use leftovers in your meal plan for the week. Look for recipes where you can cook once and make enough food for two meals. Prepare budget-friendly types of meals like stews, casseroles, and stir-fry dishes. Try some meatless meals or try "no cook" meals like salads. Make sure that half your plate is filled with fruits or vegetables. Choose from fresh, frozen, or canned fruits and vegetables. If eating canned, remember to rinse them before eating. This will remove any excess salt added for packaging. Summary Eating healthy on a budget is possible if you plan your meals according to your budget, purchase according to your budget and grocery list, and prepare food yourself. Tips for buying more food on a limited budget include buying generic brands, using coupons only for foods you normally buy, and buying healthy items from the bulk bins when available. Tips for buying cheaper food to replace expensive food include choosing cheaper, lean cuts of meat, and buying dried beans and peas. This information is not intended to replace advice given to you by your health care provider. Make sure you discuss any questions you have with your health care provider. Document Revised: 10/15/2019 Document Reviewed: 10/15/2019 Elsevier Patient Education  [place]. Exercising to [employer] Getting regular exercise is important for everyone. It is especially important if you are overweight. Being overweight increases your risk of heart disease, stroke, diabetes, high blood pressure, and several types of cancer. Exercising, and reducing the calories you consume, can help you lose weight and improve fitness and health. Exercise can be moderate or vigorous intensity. To lose weight, most people need to do a  certain amount of moderate or vigorous-intensity exercise each week. How can exercise affect me? You lose weight when you exercise enough to burn more calories than you eat. Exercise also reduces body fat and builds muscle. The more muscle you have, the more calories you burn. Exercise also: Improves mood. Reduces stress and tension. Improves your overall fitness, flexibility, and endurance. Increases bone strength. Moderate-intensity exercise  Moderate-intensity exercise is any activity that gets you moving enough to burn at least three times more energy (calories) than if you were sitting. Examples of moderate exercise include: Walking a mile in 15 minutes. Doing light yard work. Biking at an easy pace. Most people should get at least 150 minutes of moderate-intensity exercise a week to maintain their body weight. Vigorous-intensity exercise Vigorous-intensity exercise is any activity that gets you moving enough to burn at least six times more calories than if you were sitting. When you exercise at this intensity, you should be working hard enough that you are not able to carry on a conversation. Examples of vigorous exercise include: Running. Playing a team sport, such as football, basketball, and soccer. Jumping rope. Most people should get at least 75 minutes a week of vigorous exercise to maintain  their body weight. What actions can I take to lose weight? The amount of exercise you need to lose weight depends on: Your age. The type of exercise. Any health conditions you have. Your overall physical ability. Talk to your health care provider about how much exercise you need and what types of activities are safe for you. Nutrition  Make changes to your diet as told by your health care provider or diet and nutrition specialist (dietitian). This may include: Eating fewer calories. Eating more protein. Eating less unhealthy fats. Eating a diet that includes fresh fruits and  vegetables, whole grains, low-fat dairy products, and lean protein. Avoiding foods with added fat, salt, and sugar. Drink plenty of water while you exercise to prevent dehydration or heat stroke. Activity Choose an activity that you enjoy and set realistic goals. Your health care provider can help you make an exercise plan that works for you. Exercise at a moderate or vigorous intensity most days of the week. The intensity of exercise may vary from person to person. You can tell how intense a workout is for you by paying attention to your breathing and heartbeat. Most people will notice their breathing and heartbeat get faster with more intense exercise. Do resistance training twice each week, such as: Push-ups. Sit-ups. Lifting weights. Using resistance bands. Getting short amounts of exercise can be just as helpful as long, structured periods of exercise. If you have trouble finding time to exercise, try doing these things as part of your daily routine: Get up, stretch, and walk around every 30 minutes throughout the day. Go for a walk during your lunch break. Park your car farther away from your destination. If you take public transportation, get off one stop early and walk the rest of the way. Make phone calls while standing up and walking around. Take the stairs instead of elevators or escalators. Wear comfortable clothes and shoes with good support. Do not exercise so much that you hurt yourself, feel dizzy, or get very short of breath. Where to find more information [place].S. Department of Health and Human Services: [employer]at Centers for Disease Control and Prevention: http://www.wolf.info/ Contact a health care provider: Before starting a new exercise program. If you have questions or concerns about your weight. If you have a medical problem that keeps you from exercising. Get help right away if: You have any of the following while exercising: Injury. Dizziness. Difficulty breathing or  shortness of breath that does not go away when you stop exercising. Chest pain. Rapid heartbeat. These symptoms may represent a serious problem that is an emergency. Do not wait to see if the symptoms will go away. Get medical help right away. Call your local emergency services (911 in the [place] Do not drive yourself to the hospital. Summary Getting regular exercise is especially important if you are overweight. Being overweight increases your risk of heart disease, stroke, diabetes, high blood pressure, and several types of cancer. Losing weight happens when you burn more calories than you eat. Reducing the amount of calories you eat, and getting regular moderate or vigorous exercise each week, helps you lose weight. This information is not intended to replace advice given to you by your health care provider. Make sure you discuss any questions you have with your health care provider. Document Revised: 02/28/2020 Document Reviewed: 02/28/2020 Elsevier Patient Education  [place].  ------------------------------------------------------------------------------------  Check out [place]. 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    Try  some over the counter replenz, see if this helps.  Lube during sex for now.    Regards,   Eugenia Pancoast FNP-C

## 2021-10-04 LAB — CYTOLOGY - PAP
Comment: NEGATIVE
Diagnosis: NEGATIVE
High risk HPV: NEGATIVE

## 2021-10-06 ENCOUNTER — Telehealth: Payer: Self-pay

## 2021-10-06 DIAGNOSIS — L219 Seborrheic dermatitis, unspecified: Secondary | ICD-10-CM

## 2021-10-06 NOTE — Telephone Encounter (Signed)
Received refill request for following medication. You have given in past but not on current med list. Ok to refill?

## 2021-10-09 MED ORDER — KETOCONAZOLE 2 % EX SHAM: 1.0000 | MEDICATED_SHAMPOO | CUTANEOUS | 0 refills | Status: DC

## 2021-10-09 NOTE — Telephone Encounter (Signed)
Not a problem. Refilled.

## 2021-10-13 ENCOUNTER — Encounter: Payer: Self-pay | Admitting: Family

## 2021-10-16 ENCOUNTER — Telehealth: Payer: Self-pay

## 2021-10-16 NOTE — Telephone Encounter (Signed)
Left message to return call to our office.  

## 2021-10-16 NOTE — Telephone Encounter (Signed)
Patient called in returning a call she received. Informed patient that immunization record was ready up front.

## 2021-10-23 ENCOUNTER — Ambulatory Visit
Admission: RE | Admit: 2021-10-23 | Discharge: 2021-10-23 | Disposition: A | Discharge status: Home / Self Care | Payer: BC Managed Care – PPO | Source: Ambulatory Visit | Attending: Family | Admitting: Family

## 2021-10-23 DIAGNOSIS — Z1231 Encounter for screening mammogram for malignant neoplasm of breast: Secondary | ICD-10-CM | POA: Diagnosis not present

## 2021-10-25 ENCOUNTER — Other Ambulatory Visit: Payer: Self-pay | Admitting: Family

## 2021-10-25 DIAGNOSIS — N6489 Other specified disorders of breast: Secondary | ICD-10-CM

## 2021-10-25 DIAGNOSIS — R928 Other abnormal and inconclusive findings on diagnostic imaging of breast: Secondary | ICD-10-CM

## 2021-11-01 ENCOUNTER — Encounter: Payer: Self-pay | Admitting: Family

## 2021-11-01 DIAGNOSIS — F419 Anxiety disorder, unspecified: Secondary | ICD-10-CM

## 2021-11-02 MED ORDER — BUPROPION HCL ER (XL) 300 MG PO TB24: 300.0000 mg | ORAL_TABLET | Freq: Every day | ORAL | 1 refills | Status: DC

## 2021-11-16 ENCOUNTER — Ambulatory Visit
Admission: RE | Admit: 2021-11-16 | Discharge: 2021-11-16 | Disposition: A | Discharge status: Home / Self Care | Payer: BC Managed Care – PPO | Source: Ambulatory Visit | Attending: Family | Admitting: Family

## 2021-11-16 DIAGNOSIS — R928 Other abnormal and inconclusive findings on diagnostic imaging of breast: Secondary | ICD-10-CM

## 2021-11-16 DIAGNOSIS — N6489 Other specified disorders of breast: Secondary | ICD-10-CM | POA: Diagnosis not present

## 2021-11-16 DIAGNOSIS — R92313 Mammographic fatty tissue density, bilateral breasts: Secondary | ICD-10-CM | POA: Diagnosis not present

## 2021-12-12 ENCOUNTER — Other Ambulatory Visit: Payer: Self-pay

## 2021-12-12 DIAGNOSIS — L219 Seborrheic dermatitis, unspecified: Secondary | ICD-10-CM

## 2021-12-12 MED ORDER — KETOCONAZOLE 2 % EX SHAM: 1.0000 | MEDICATED_SHAMPOO | CUTANEOUS | 0 refills | Status: DC

## 2022-01-09 ENCOUNTER — Other Ambulatory Visit: Payer: Self-pay | Admitting: Family

## 2022-01-09 DIAGNOSIS — E7800 Pure hypercholesterolemia, unspecified: Secondary | ICD-10-CM

## 2022-01-25 ENCOUNTER — Telehealth: Payer: Self-pay | Admitting: Family

## 2022-01-25 NOTE — Telephone Encounter (Signed)
Agree with precautions given to pt  Agree with nurse assessment in plan.  Thank you for speaking with them. 

## 2022-01-25 NOTE — Telephone Encounter (Signed)
Patient sent over a MyChart message scheduling an appointment for pain under left rib. 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 She stated that the pain is about a 6. Sent over to access nurse.

## 2022-01-25 NOTE — Telephone Encounter (Signed)
Adrienne RN with access nurse said pt has upper lt abd pain with disposition of being seen within 4 hrs. No available appts at [facility] or [place]; Vincente Liberty said she will advise pt to go to [facility] or ED.Will attach access nurse note when received.   I spoke with pt and she is taking her son to doctors appt and then pt will go to UC to be eval. Sending note to Red Christians FNP.   [facility] - Client TELEPHONE ADVICE RECORD AccessNurse Patient Name: Tina Schmidt Gender: Female DOB: 09/14/1980 Age: 42 Y 110 M 11 D Return Phone Number: [phone] (Primary) Address: City/ State/ Zip: [place] [address] Client [facility] Day - Client Client Site [place] - Day Provider AA - PHYSICIAN, NOT LISTED- MD Contact Type Call Who Is Calling Patient / Member / Family / Caregiver Call Type Triage / Clinical Relationship To Patient Self Return Phone Number [phone] (Primary) Chief Complaint CHEST PAIN - pain, pressure, heaviness or tightness Reason for Call Symptomatic / Request for [facility] has a patient and who is experiencing pain under who left rib and when she moves and presses on it and she says on the scale of 1-10 it's six. Translation No Nurse Assessment Nurse: D'Heur Lucia Gaskins, RN, Adrienne Date/Time (Eastern Time): 01/25/2022 3:18:06 PM Confirm and document reason for call. If symptomatic, describe symptoms. ---Caller has a patient and who is experiencing pain under who left rib and when she moves and presses on it and she says on the scale of 1-10 it's six. She noticed it on Monday. When she bends down or lays on her left side. When she is upright it is gone. It is at the bottom of the ribs. No recent injury, no fever. Does the patient have any new or worsening symptoms? ---Yes Will a triage be completed? ---Yes Related visit to physician within the last 2 weeks?  ---No Does the PT have any chronic conditions? (i.e. diabetes, asthma, this includes High risk factors for pregnancy, etc.) ---Yes List chronic conditions. ---high cholesterol Is the patient pregnant or possibly pregnant? (Ask all females between the ages of 68-55) ---No Is this a behavioral health or substance abuse call? ---No Guidelines Guideline Title Affirmed Question Affirmed Notes Nurse Date/Time Eilene Ghazi Time) Abdominal Pain - Upper [1] MILDMODERATE pain [place], RN, Vincente Liberty 01/25/2022 3:21:11 PM PLEASE NOTE: All timestamps contained within this report are represented as [place] Standard Time. CONFIDENTIALTY NOTICE: This fax transmission is intended only for the addressee. It contains information that is legally privileged, confidential or otherwise protected from use or disclosure. If you are not the intended recipient, you are strictly prohibited from reviewing, disclosing, copying using or disseminating any of this information or taking any action in reliance on or regarding this information. If you have received this fax in error, please notify us immediately by telephone so that we can arrange for its return to [place]. Phone: [phone], Toll-Free: [phone], Fax: [phone] Page: 2 of 2 Call Id: [id number] Guidelines Guideline Title Affirmed Question Affirmed Notes Nurse Date/Time Eilene Ghazi Time) AND [2] not relieved by antacid medicine Disp. Time Eilene Ghazi Time) Disposition Final User 01/25/2022 3:15:08 PM Send to Urgent Queue Abigail Butts 01/25/2022 3:26:36 PM See HCP within 4 Hours (or PCP triage) Yes D'Heur Lucia Gaskins RN, Adrienne Final Disposition 01/25/2022 3:26:36 PM See HCP within 4 Hours (or PCP triage) Yes D'Heur Lucia Gaskins, RN, Ebbie Latus Disagree/Comply Comply Caller Understands  Yes PreDisposition Call Doctor Care Advice Given Per Guideline SEE HCP (OR PCP TRIAGE) WITHIN 4 HOURS: * IF OFFICE WILL BE OPEN: You need to be seen within the next  3 or 4 hours. Call your doctor (or NP/PA) now or as soon as the office opens. CALL BACK IF: * You become worse CARE ADVICE given per Abdominal Pain - Upper (Adult) guideline. Comments User: Vincente Liberty, D'Heur Lucia Gaskins, RN Date/Time Eilene Ghazi Time): 01/25/2022 3:22:45 PM Two weeks ago, caller states she had very bad bloating on the left side of her abdomen and it lasted 4-5 days. Referrals REFERRED TO PCP OFFIC

## 2022-01-31 ENCOUNTER — Encounter: Payer: Self-pay | Admitting: Family

## 2022-01-31 ENCOUNTER — Ambulatory Visit: Payer: BC Managed Care – PPO | Admitting: Family

## 2022-01-31 VITALS — BP 122/78 | HR 85 | Temp 98.2°F | Ht 62.5 in | Wt 164.4 lb

## 2022-01-31 DIAGNOSIS — M940 Chondrocostal junction syndrome [Tietze]: Secondary | ICD-10-CM | POA: Diagnosis not present

## 2022-01-31 DIAGNOSIS — R1012 Left upper quadrant pain: Secondary | ICD-10-CM | POA: Insufficient documentation

## 2022-01-31 DIAGNOSIS — Z79899 Other long term (current) drug therapy: Secondary | ICD-10-CM | POA: Diagnosis not present

## 2022-01-31 DIAGNOSIS — R12 Heartburn: Secondary | ICD-10-CM | POA: Diagnosis not present

## 2022-01-31 LAB — COMPREHENSIVE METABOLIC PANEL
ALT: 16 U/L (ref 0–35)
AST: 19 U/L (ref 0–37)
Albumin: 4.5 g/dL (ref 3.5–5.2)
Alkaline Phosphatase: 63 U/L (ref 39–117)
BUN: 9 mg/dL (ref 6–23)
CO2: 29 mEq/L (ref 19–32)
Calcium: 9.9 mg/dL (ref 8.4–10.5)
Chloride: 102 mEq/L (ref 96–112)
Creatinine, Ser: 0.87 mg/dL (ref 0.40–1.20)
GFR: 82.15 mL/min (ref >60.00)
Glucose, Bld: 103 mg/dL — ABNORMAL HIGH (ref 70–99)
Potassium: 4.2 mEq/L (ref 3.5–5.1)
Sodium: 136 mEq/L (ref 135–145)
Total Bilirubin: 0.2 mg/dL (ref 0.2–1.2)
Total Protein: 7.6 g/dL (ref 6.0–8.3)

## 2022-01-31 LAB — CBC
HCT: 36 % (ref 36.0–46.0)
Hemoglobin: 12.3 g/dL (ref 12.0–15.0)
MCHC: 34.2 g/dL (ref 30.0–36.0)
MCV: 86.2 fl (ref 78.0–100.0)
Platelets: 340 10*3/uL (ref 150.0–400.0)
RBC: 4.17 Mil/uL (ref 3.87–5.11)
RDW: 13.2 % (ref 11.5–15.5)
WBC: 5.7 10*3/uL (ref 4.0–10.5)

## 2022-01-31 LAB — AMYLASE: Amylase: 50 U/L (ref 27–131)

## 2022-01-31 LAB — LIPASE: Lipase: 32 U/L (ref 11.0–59.0)

## 2022-01-31 MED ORDER — OMEPRAZOLE 20 MG PO CPDR: 20.0000 mg | DELAYED_RELEASE_CAPSULE | Freq: Every day | ORAL | 0 refills | Status: DC

## 2022-01-31 NOTE — Patient Instructions (Signed)
  I have ordered imaging for you at [facility] outpatient diagnostic center, u/s abdomen. This order has been sent over for you electronically.  Please call [phone] to schedule this appointment.  Stop by the lab prior to leaving today. I will notify you of your results once received.    Regards,   Eugenia Pancoast FNP-C

## 2022-01-31 NOTE — Progress Notes (Signed)
Established Patient Office Visit  Subjective:   Patient ID: Tina Schmidt, female    DOB: [birth year]  Age: 42 y.o. MRN: [id number]  CC:  Chief Complaint  Patient presents with   Pain    Left rib pain.    HPI: Tina Schmidt is a 42 y.o. female presenting on 01/31/2022 for Pain (Left rib pain.)  Has noticed at work that she has tenderness and something is 'swollen' on left upper side. This started at the beginning of last week. Only hurts when she presses on it. Doesn't hurt today however does feel over the last  Aggravated by movement, specifically when bending down.  Last week so uncomfortable she had to squat and bend over. Wasn't aggravated by other movement. Felt like it was more under the rib at the time. Last episode was yesterday, lasts only during this movement of bending down.   She has worried herself that she might have pancreatitis.  She works at dermatology office, so wiping tables, biopsies etc  Denies using ibuprofen, tylenol or pain medication often. Has been drinking alcohol more than usual, last month was going out often with friends, every weekend and sometimes during the week about twice a week.      ROS: Negative unless specifically indicated above in HPI.   Relevant past medical history reviewed and updated as indicated.   Allergies and medications reviewed and updated.   Current Outpatient Medications:    atorvastatin (LIPITOR) 10 MG tablet, Take 1 tablet by mouth daily., Disp: 90 tablet, Rfl: 0   buPROPion (WELLBUTRIN XL) 300 MG 24 hr tablet, Take 1 tablet (300 mg total) by mouth daily., Disp: 90 tablet, Rfl: 1   Cholecalciferol (VITAMIN D3) 50 MCG (2000 UT) TABS, Take 1 tablet by mouth daily., Disp: , Rfl:    clobetasol (TEMOVATE) 0.05 % external solution, PRN, Disp: , Rfl:    cyclobenzaprine (FLEXERIL) 10 MG tablet, Take 1 tablet (10 mg total) by mouth 3 (three) times daily as needed for muscle spasms., Disp: 30 tablet, Rfl: 0   diclofenac  (VOLTAREN) 75 MG EC tablet, , Disp: , Rfl:    ferrous sulfate 325 (65 FE) MG tablet, Take 325 mg by mouth daily with breakfast., Disp: , Rfl:    fluconazole (DIFLUCAN) 150 MG tablet, Take on po qd for one dose, repeat in three days if still with symptoms, repeat again in three days if still with symptoms., Disp: 3 tablet, Rfl: 0   HYDROcodone-acetaminophen (NORCO) 10-325 MG tablet, Take by mouth., Disp: , Rfl:    hydrocortisone (ANUSOL-HC) 2.5 % rectal cream, Place 1 application. rectally 2 (two) times daily., Disp: 30 g, Rfl: 0   ketoconazole (NIZORAL) 2 % shampoo, Apply 1 Application topically 2 (two) times a week., Disp: 120 mL, Rfl: 0   meloxicam (MOBIC) 15 MG tablet, Take 1 tablet every day by oral route., Disp: , Rfl:    nystatin (MYCOSTATIN/NYSTOP) powder, Apply 1 application. topically 3 (three) times daily., Disp: 15 g, Rfl: 0   omeprazole (PRILOSEC) 20 MG capsule, Take 1 capsule (20 mg total) by mouth daily., Disp: 30 capsule, Rfl: 0   predniSONE (DELTASONE) 10 MG tablet, 6 day taper - Take as directed, Disp: , Rfl:    predniSONE (STERAPRED UNI-PAK 21 TAB) 10 MG (21) TBPK tablet, Take by mouth., Disp: , Rfl:   No Known Allergies  Objective:   BP 122/78   Pulse 85   Temp 98.2 F (36.8 C) (Oral)   Ht 5'  2.5" (1.588 m)   Wt 164 lb 6.4 oz (74.6 kg)   LMP 01/16/2022   SpO2 99%   BMI 29.59 kg/m    Physical Exam Chest:    Abdominal:     General: Abdomen is flat. Bowel sounds are normal.     Palpations: Abdomen is soft.     Tenderness: There is abdominal tenderness in the left upper quadrant.     Assessment & Plan:  Left upper quadrant abdominal pain Assessment & Plan: Labs to evaluate further  U/s abdomen ordered pending results Pt advised to:  If worsening pain, n/v seek more urgent care   For heartburn rx omeprazole 20 mg x 2 weeks  Try to decrease and or avoid spicy foods, fried fatty foods, and also caffeine and chocolate as these can increase heartburn symptoms.     Orders: -     US Abdomen Complete; Future -     CBC -     Comprehensive metabolic panel -     Lipase -     Amylase  On statin therapy Assessment & Plan: Cmp ordered pending results.   Orders: -     Comprehensive metabolic panel  Heartburn -     Omeprazole; Take 1 capsule (20 mg total) by mouth daily.  Dispense: 30 capsule; Refill: 0  Costochondritis Assessment & Plan: Handout given to pt  Heat to site.        Follow up plan: Return for as scheduled.  Tina Pancoast, FNP

## 2022-01-31 NOTE — Assessment & Plan Note (Signed)
Handout given to pt  Heat to site.

## 2022-01-31 NOTE — Assessment & Plan Note (Signed)
Labs to evaluate further  U/s abdomen ordered pending results Pt advised to:  If worsening pain, n/v seek more urgent care   For heartburn rx omeprazole 20 mg x 2 weeks  Try to decrease and or avoid spicy foods, fried fatty foods, and also caffeine and chocolate as these can increase heartburn symptoms.

## 2022-01-31 NOTE — Assessment & Plan Note (Signed)
Cmp ordered pending results.

## 2022-02-07 ENCOUNTER — Ambulatory Visit
Admission: RE | Admit: 2022-02-07 | Discharge: 2022-02-07 | Disposition: A | Discharge status: Home / Self Care | Payer: BC Managed Care – PPO | Source: Ambulatory Visit | Attending: Family | Admitting: Family

## 2022-02-07 DIAGNOSIS — R1012 Left upper quadrant pain: Secondary | ICD-10-CM | POA: Diagnosis not present

## 2022-02-07 DIAGNOSIS — Z9049 Acquired absence of other specified parts of digestive tract: Secondary | ICD-10-CM | POA: Diagnosis not present

## 2022-02-15 ENCOUNTER — Other Ambulatory Visit: Payer: Self-pay | Admitting: Family

## 2022-02-15 DIAGNOSIS — L219 Seborrheic dermatitis, unspecified: Secondary | ICD-10-CM

## 2022-02-19 MED ORDER — KETOCONAZOLE 2 % EX SHAM: 1.0000 | MEDICATED_SHAMPOO | CUTANEOUS | 0 refills | Status: DC

## 2022-03-02 ENCOUNTER — Other Ambulatory Visit: Payer: Self-pay | Admitting: Family

## 2022-03-02 DIAGNOSIS — M5412 Radiculopathy, cervical region: Secondary | ICD-10-CM | POA: Diagnosis not present

## 2022-03-02 DIAGNOSIS — M5030 Other cervical disc degeneration, unspecified cervical region: Secondary | ICD-10-CM | POA: Diagnosis not present

## 2022-03-02 DIAGNOSIS — R12 Heartburn: Secondary | ICD-10-CM

## 2022-04-02 ENCOUNTER — Ambulatory Visit: Payer: BC Managed Care – PPO | Admitting: Family

## 2022-04-28 ENCOUNTER — Other Ambulatory Visit: Payer: Self-pay | Admitting: Nurse Practitioner

## 2022-04-28 DIAGNOSIS — E7800 Pure hypercholesterolemia, unspecified: Secondary | ICD-10-CM

## 2022-05-09 IMAGING — US US THYROID
1 series · 14 of 25 positions shown · non-contrast
Comparison: None.

CLINICAL DATA: Goiter.

EXAM:
THYROID ULTRASOUND
TECHNIQUE: Ultrasound examination of the thyroid gland and adjacent soft
tissues was performed.

[Series 1: us thyroid · 0.06mm/px · 14 of 65 slices shown]
[im 1/65]
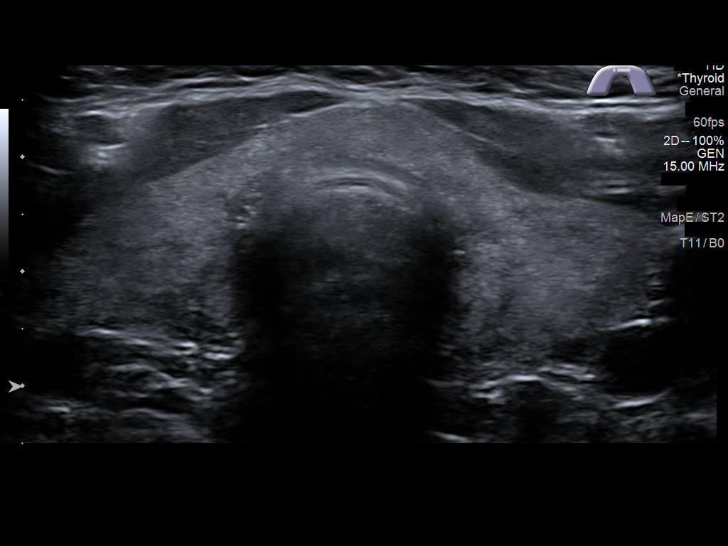
[im 6/65]
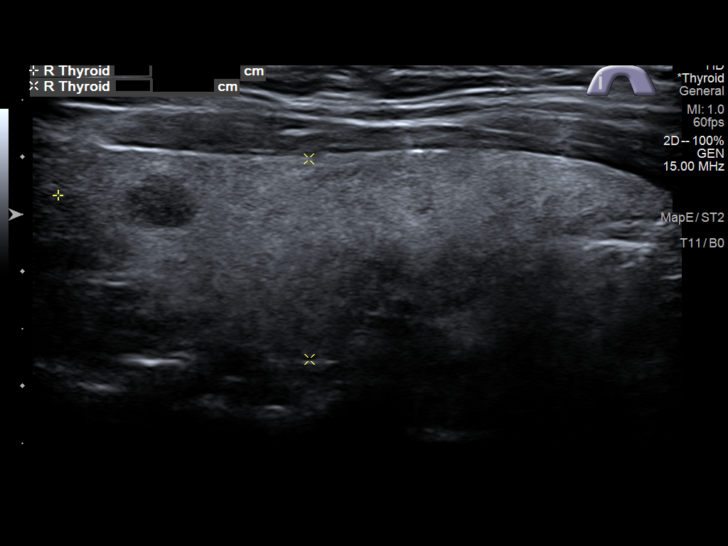
[im 11/65]
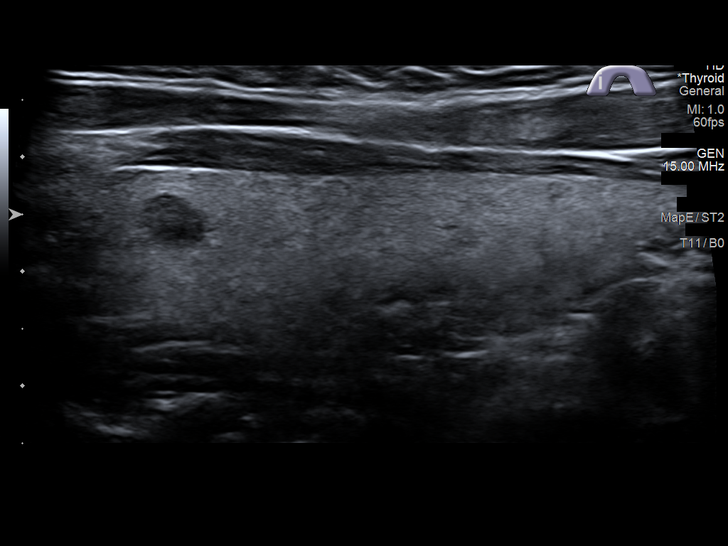
[im 17/65]
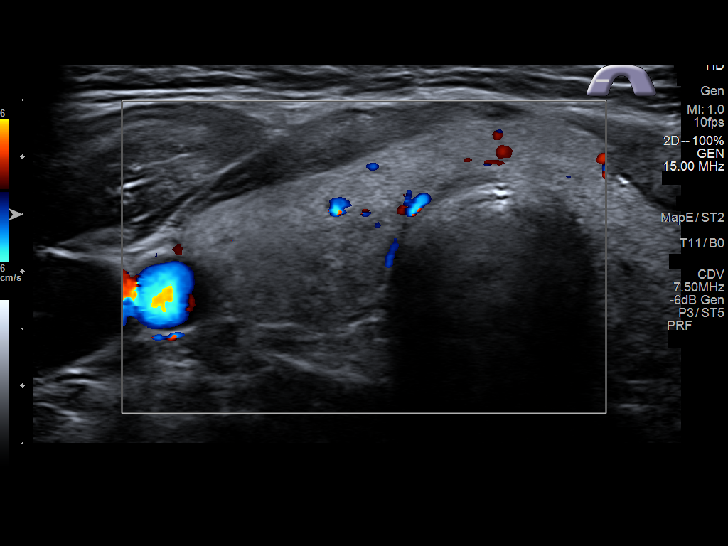
[im 22/65]
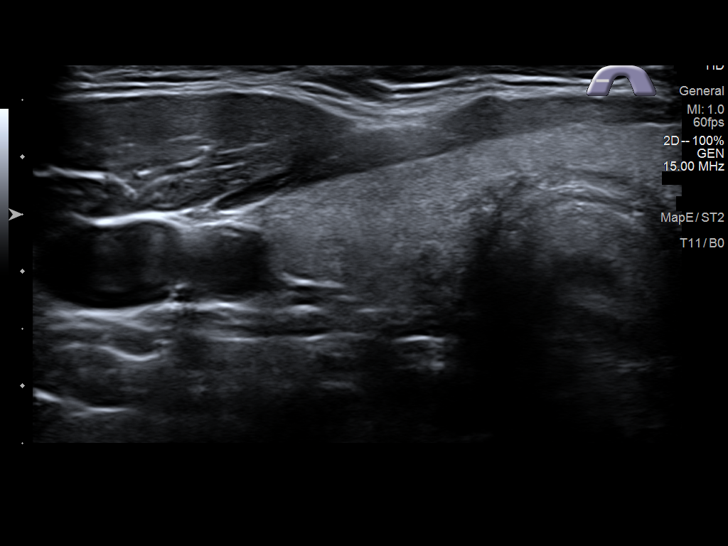
[im 25/65]
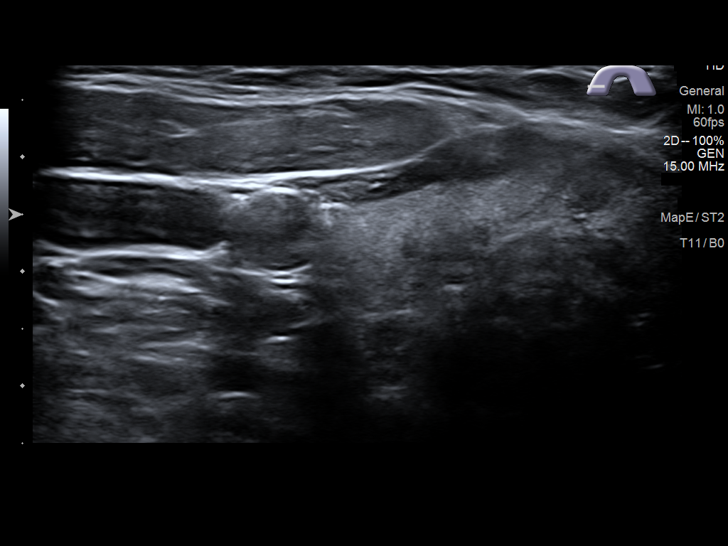
[im 30/65]
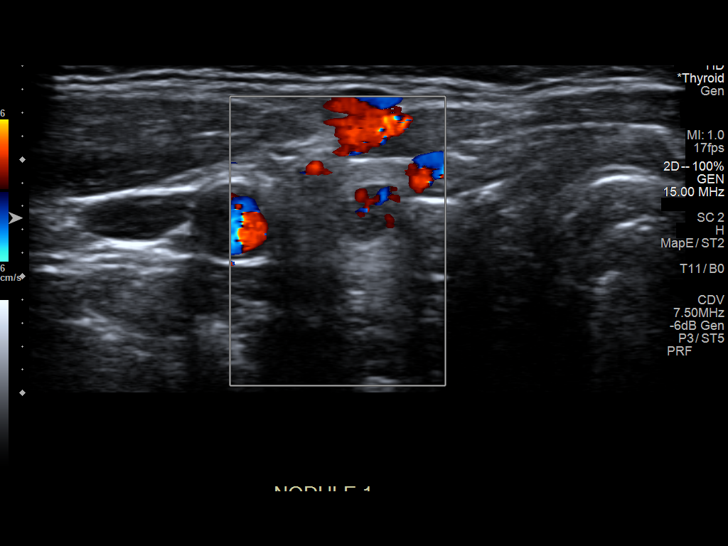
[im 35/65]
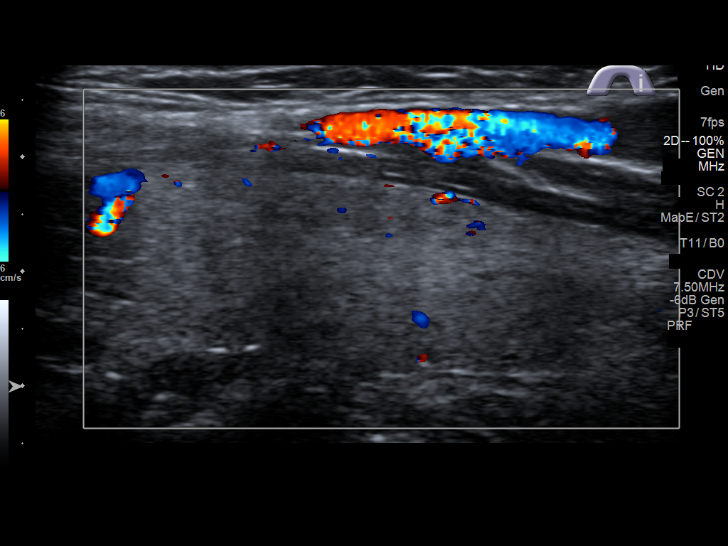
[im 41/65]
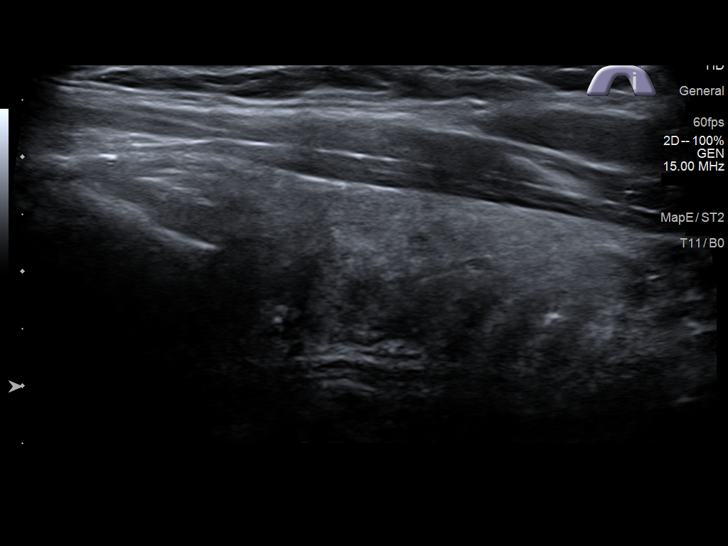
[im 43/65]
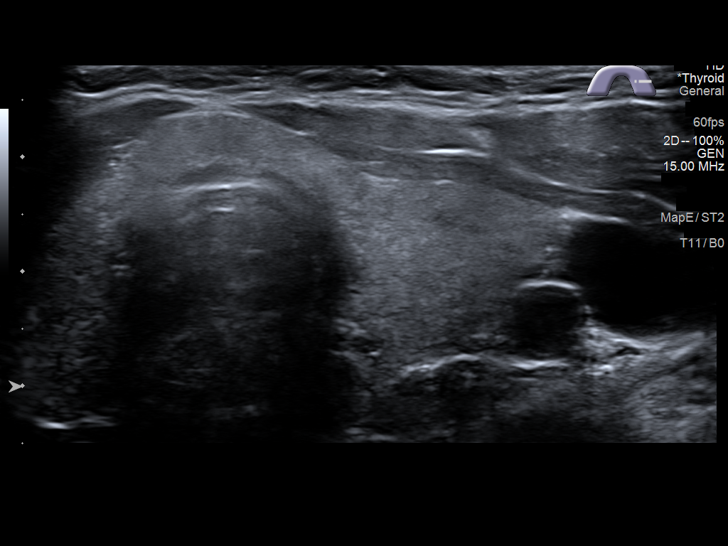
[im 49/65]
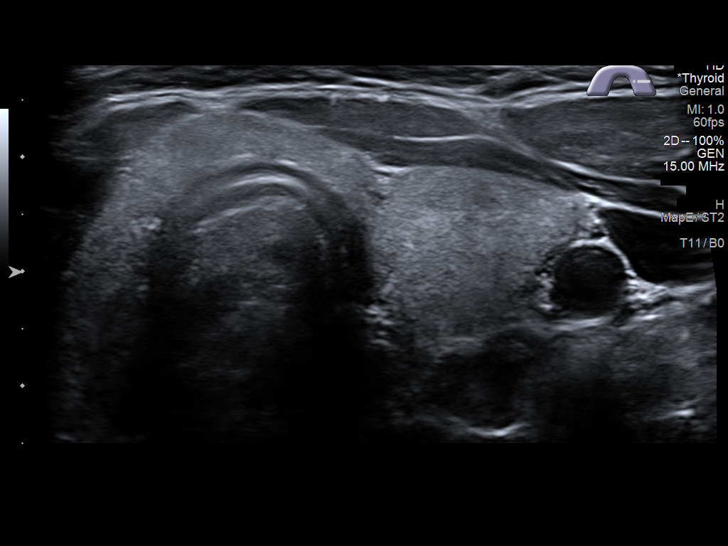
[im 54/65]
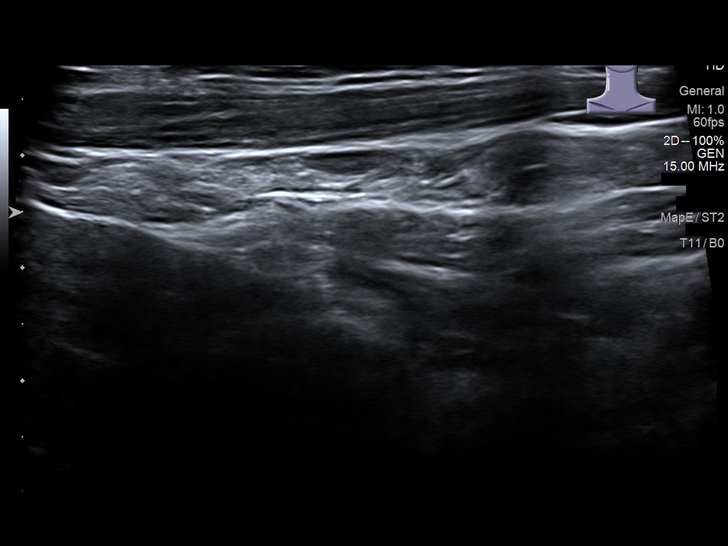
[im 59/65]
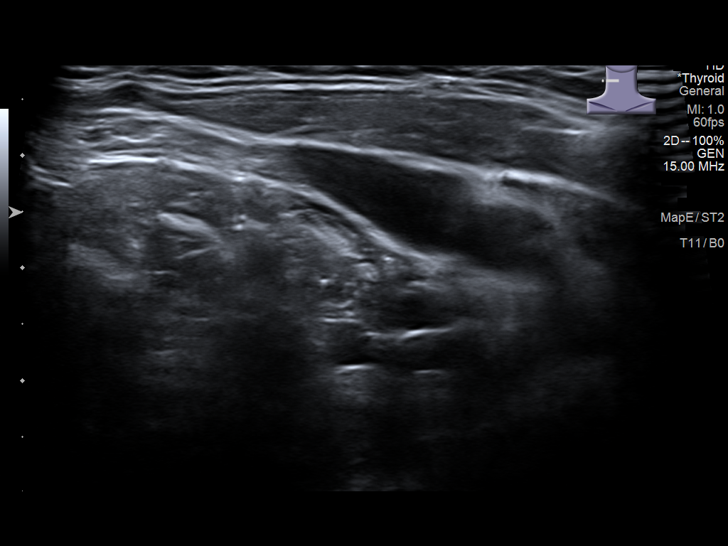
[im 65/65]
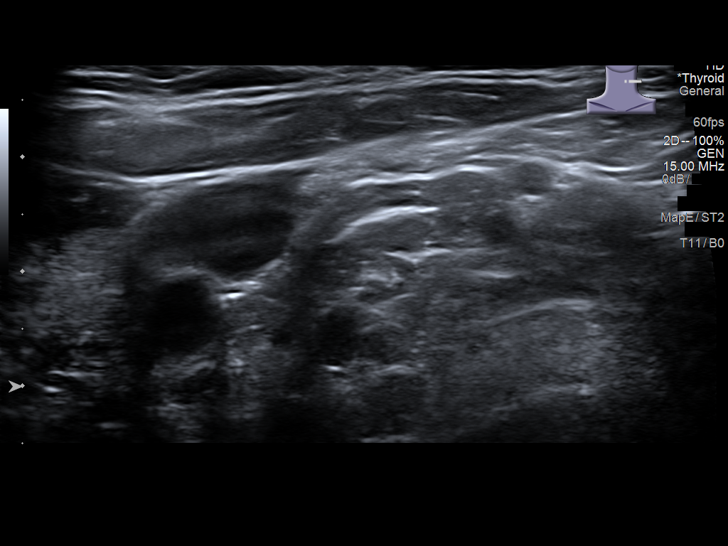

[14 of 25 positions shown; findings below may reference images not displayed]

FINDINGS: Parenchymal Echotexture: Mildly heterogenous

Isthmus: 0.5 cm

Right lobe: 5.4 x 1.8 x 1.8 cm

Left lobe: 5.4 x 1.7 x 2.1 cm

_________________________________________________________

Estimated total number of nodules >/= 1 cm: 0

Number of spongiform nodules >/=  2 cm not described below (TR1): 0

Number of mixed cystic and solid nodules >/= 1.5 cm not described
below (TR2): 0

_________________________________________________________

No discrete nodules of consequence are seen within the thyroid
gland. A tiny subcentimeter (0.6 cm) hypoechoic nodule is noted
incidentally in the right upper gland. This nodule does not meet
criteria to warrant further evaluation.
IMPRESSION: No thyroid nodules of consequence identified.

The above is in keeping with the ACR TI-RADS recommendations - [HOSPITAL] 3877;[DATE].

## 2022-06-15 ENCOUNTER — Encounter: Payer: Self-pay | Admitting: Family

## 2022-06-22 ENCOUNTER — Ambulatory Visit: Payer: BC Managed Care – PPO | Admitting: Family

## 2022-06-22 ENCOUNTER — Telehealth: Payer: Self-pay

## 2022-06-22 ENCOUNTER — Encounter: Payer: Self-pay | Admitting: Family

## 2022-06-22 VITALS — BP 122/72 | HR 80 | Temp 98.3°F | Ht 62.5 in | Wt 164.6 lb

## 2022-06-22 DIAGNOSIS — R7303 Prediabetes: Secondary | ICD-10-CM

## 2022-06-22 DIAGNOSIS — D509 Iron deficiency anemia, unspecified: Secondary | ICD-10-CM

## 2022-06-22 DIAGNOSIS — Z79899 Other long term (current) drug therapy: Secondary | ICD-10-CM | POA: Diagnosis not present

## 2022-06-22 DIAGNOSIS — R1013 Epigastric pain: Secondary | ICD-10-CM | POA: Insufficient documentation

## 2022-06-22 DIAGNOSIS — R142 Eructation: Secondary | ICD-10-CM

## 2022-06-22 DIAGNOSIS — K2100 Gastro-esophageal reflux disease with esophagitis, without bleeding: Secondary | ICD-10-CM

## 2022-06-22 DIAGNOSIS — K5903 Drug induced constipation: Secondary | ICD-10-CM

## 2022-06-22 DIAGNOSIS — E7800 Pure hypercholesterolemia, unspecified: Secondary | ICD-10-CM | POA: Diagnosis not present

## 2022-06-22 DIAGNOSIS — E559 Vitamin D deficiency, unspecified: Secondary | ICD-10-CM

## 2022-06-22 LAB — HEPATIC FUNCTION PANEL
ALT: 15 U/L (ref 0–35)
AST: 17 U/L (ref 0–37)
Albumin: 4.9 g/dL (ref 3.5–5.2)
Alkaline Phosphatase: 68 U/L (ref 39–117)
Bilirubin, Direct: 0.1 mg/dL (ref 0.0–0.3)
Total Bilirubin: 0.4 mg/dL (ref 0.2–1.2)
Total Protein: 7.9 g/dL (ref 6.0–8.3)

## 2022-06-22 LAB — LIPID PANEL
Cholesterol: 187 mg/dL (ref 0–200)
HDL: 55.9 mg/dL (ref >39.00)
LDL Cholesterol: 103 mg/dL — ABNORMAL HIGH (ref 0–99)
NonHDL: 130.7
Total CHOL/HDL Ratio: 3
Triglycerides: 140 mg/dL (ref 0.0–149.0)
VLDL: 28 mg/dL (ref 0.0–40.0)

## 2022-06-22 LAB — HEMOGLOBIN A1C: Hgb A1c MFr Bld: 6.1 % (ref 4.6–6.5)

## 2022-06-22 LAB — VITAMIN D 25 HYDROXY (VIT D DEFICIENCY, FRACTURES): VITD: 46.2 ng/mL (ref 30.00–100.00)

## 2022-06-22 MED ORDER — PANTOPRAZOLE SODIUM 20 MG PO TBEC
20.0000 mg | DELAYED_RELEASE_TABLET | Freq: Every day | ORAL | 0 refills | Status: DC
Start: 2022-06-22 — End: 2022-08-31

## 2022-06-22 MED ORDER — PANTOPRAZOLE SODIUM 20 MG PO TBEC
20.0000 mg | DELAYED_RELEASE_TABLET | Freq: Every day | ORAL | 0 refills | Status: DC
Start: 2022-06-22 — End: 2022-06-22

## 2022-06-22 NOTE — Telephone Encounter (Signed)
Refill for pantoprazole (PROTONIX) 20 MG tablet .sent

## 2022-06-22 NOTE — Assessment & Plan Note (Signed)
Trial ppi rx pantoprazole Ordering alpha gal and h pylori  Pending results  Referral placed for GI incase negative, and needing possible endoscopy

## 2022-06-22 NOTE — Assessment & Plan Note (Signed)
Ordered vitamin d pending results.   

## 2022-06-22 NOTE — Progress Notes (Signed)
Established Patient Office Visit  Subjective:   Patient ID: Tina Schmidt, female    DOB: [birth year]  Age: 42 y.o. MRN: [id number]  CC:  Chief Complaint  Patient presents with   Constipation    Need a referral to GI. Bowel issues has resolved. Also wants A1C, Lipid, Hepatic Panel labs done.    HPI: Tina Schmidt is a 42 y.o. female presenting on 06/22/2022 for Constipation (Need a referral to GI. Bowel issues has resolved. Also wants A1C, Lipid, Hepatic Panel labs done.)   Constipation   States that about two weeks had increased gas, and abdominal cramping was very uncomfortable, and tried laxatives without relief. She states her husband finally gave her an enema and she had relief about five minutes after that.   She did recently just start working out and lifting weights about three weeks ago so she knows this was not the issue. She is drinking about 60 oz water daily. She does state that she ate a potato pancake right before the sx started with potatoes and a lot of cheese. She has been keeping cheese out of her diet since then and feels has been slightly better.   She has not had constipation prior to this, not since she was pregnant years ago.  She does take daily iron and she states one week prior to this she ran out of stool softeners on which she is back on .   Did have colonoscopy 2021, with unremarkable findings advised to continue at 42 y/o for regular screening exams   Does not eat beef because she states gives her horrible gas. She also doesn't do well with steak, feels heavy on her stomach as well.  She does have heartburn 'all of the time' and burps often.  Did take omeprazole for thirty days helped slightly but is back again with heartburn.    ROS: Negative unless specifically indicated above in HPI.   Relevant past medical history reviewed and updated as indicated.   Allergies and medications reviewed and updated.   Current Outpatient Medications:     atorvastatin (LIPITOR) 10 MG tablet, Take 1 tablet by mouth daily., Disp: 90 tablet, Rfl: 0   buPROPion (WELLBUTRIN XL) 300 MG 24 hr tablet, Take 1 tablet (300 mg total) by mouth daily., Disp: 90 tablet, Rfl: 1   Cholecalciferol (VITAMIN D3) 50 MCG (2000 UT) TABS, Take 1 tablet by mouth daily., Disp: , Rfl:    clobetasol (TEMOVATE) 0.05 % external solution, PRN, Disp: , Rfl:    cyclobenzaprine (FLEXERIL) 10 MG tablet, Take 1 tablet (10 mg total) by mouth 3 (three) times daily as needed for muscle spasms., Disp: 30 tablet, Rfl: 0   ferrous sulfate 325 (65 FE) MG tablet, Take 325 mg by mouth daily with breakfast., Disp: , Rfl:    HYDROcodone-acetaminophen (NORCO) 10-325 MG tablet, Take by mouth., Disp: , Rfl:    hydrocortisone (ANUSOL-HC) 2.5 % rectal cream, Place 1 application. rectally 2 (two) times daily., Disp: 30 g, Rfl: 0   ketoconazole (NIZORAL) 2 % shampoo, Apply 1 Application topically 2 (two) times a week., Disp: 120 mL, Rfl: 0   meloxicam (MOBIC) 15 MG tablet, Take 1 tablet every day by oral route., Disp: , Rfl:    nystatin (MYCOSTATIN/NYSTOP) powder, Apply 1 application. topically 3 (three) times daily., Disp: 15 g, Rfl: 0   pantoprazole (PROTONIX) 20 MG tablet, Take 1 tablet (20 mg total) by mouth daily., Disp: 30 tablet, Rfl: 0   predniSONE (  DELTASONE) 10 MG tablet, 6 day taper - Take as directed, Disp: , Rfl:    predniSONE (STERAPRED UNI-PAK 21 TAB) 10 MG (21) TBPK tablet, Take by mouth., Disp: , Rfl:   No Known Allergies  Objective:   BP 122/72   Pulse 80   Temp 98.3 F (36.8 C) (Temporal)   Ht 5' 2.5" (1.588 m)   Wt 164 lb 9.6 oz (74.7 kg)   LMP 06/04/2022   SpO2 98%   BMI 29.63 kg/m    Physical Exam Constitutional:      General: She is not in acute distress.    Appearance: Normal appearance. She is normal weight. She is not ill-appearing, toxic-appearing or diaphoretic.  HENT:     Head: Normocephalic.  Cardiovascular:     Rate and Rhythm: Normal rate and regular  rhythm.  Pulmonary:     Effort: Pulmonary effort is normal.     Breath sounds: Normal breath sounds.  Abdominal:     General: Abdomen is flat. Bowel sounds are normal.     Tenderness: There is abdominal tenderness (epigastric tenderness).  Musculoskeletal:        General: Normal range of motion.  Neurological:     General: No focal deficit present.     Mental Status: She is alert and oriented to person, place, and time. Mental status is at baseline.  Psychiatric:        Mood and Affect: Mood normal.        Behavior: Behavior normal.        Thought Content: Thought content normal.        Judgment: Judgment normal.     Assessment & Plan:  Prediabetes Assessment & Plan: Pt advised of the following: Work on a diabetic diet, try to incorporate exercise at least 20-30 a day for 3 days a week or more.    Orders: -     Hemoglobin A1c  On statin therapy -     Hepatic function panel  Iron deficiency anemia, unspecified iron deficiency anemia type  Hypercholesterolemia -     Lipid panel  Vitamin D deficiency Assessment & Plan: Ordered vitamin d pending results.    Orders: -     VITAMIN D 25 Hydroxy (Vit-D Deficiency, Fractures)  Drug-induced constipation Assessment & Plan: Likely iron causing constipation  Continue daily stool softener continue fiber diet.  Fod map diet form given to patient to review.    Epigastric pain Assessment & Plan: Trial ppi rx pantoprazole Ordering alpha gal and h pylori  Pending results  Referral placed for GI incase negative, and needing possible endoscopy   Orders: -     Alpha-Gal Panel -     H. pylori breath test -     Ambulatory referral to Gastroenterology  Burping -     Alpha-Gal Panel -     H. pylori breath test -     Ambulatory referral to Gastroenterology  Gastroesophageal reflux disease with esophagitis without hemorrhage -     Ambulatory referral to Gastroenterology -     Pantoprazole Sodium; Take 1 tablet (20 mg total)  by mouth daily.  Dispense: 30 tablet; Refill: 0     Follow up plan: Return in about 6 months (around 12/22/2022) for f/u prediabetes .  Mort Sawyers, FNP

## 2022-06-22 NOTE — Assessment & Plan Note (Signed)
Likely iron causing constipation  Continue daily stool softener continue fiber diet.  Fod map diet form given to patient to review.

## 2022-06-22 NOTE — Assessment & Plan Note (Signed)
Pt advised of the following: Work on a diabetic diet, try to incorporate exercise at least 20-30 a day for 3 days a week or more.   

## 2022-06-25 LAB — ALPHA-GAL PANEL
Beef: <0.1 kU/L
Class: 0

## 2022-06-28 ENCOUNTER — Telehealth: Payer: Self-pay

## 2022-06-28 NOTE — Telephone Encounter (Signed)
Pt is a pt of [facility] pt would like to go back to [facility]. Per pt she will rather go back to [facility]

## 2022-06-29 LAB — ALPHA-GAL PANEL
Allergen, Mutton, f88: <0.1 kU/L
Allergen, Pork, f26: <0.1 kU/L
CLASS: 0
CLASS: 0
GALACTOSE-ALPHA-1,3-GALACTOSE IGE*: <0.1 kU/L (ref <0.10)

## 2022-06-29 LAB — INTERPRETATION:

## 2022-06-29 LAB — H. PYLORI BREATH TEST: H. pylori Breath Test: NOT DETECTED

## 2022-07-12 ENCOUNTER — Other Ambulatory Visit: Payer: Self-pay | Admitting: Family

## 2022-07-12 DIAGNOSIS — E7800 Pure hypercholesterolemia, unspecified: Secondary | ICD-10-CM

## 2022-07-22 ENCOUNTER — Encounter: Payer: Self-pay | Admitting: Family

## 2022-07-24 ENCOUNTER — Other Ambulatory Visit: Payer: Self-pay

## 2022-07-24 DIAGNOSIS — K2100 Gastro-esophageal reflux disease with esophagitis, without bleeding: Secondary | ICD-10-CM

## 2022-07-24 NOTE — Telephone Encounter (Signed)
LAST APPOINTMENT DATE: 06/22/2022   NEXT APPOINTMENT DATE: Visit date not found    LAST REFILL: 06/22/22  QTY: #30 no rf

## 2022-08-09 ENCOUNTER — Encounter: Payer: Self-pay | Admitting: Gastroenterology

## 2022-08-16 ENCOUNTER — Encounter: Payer: Self-pay | Admitting: Family

## 2022-08-16 DIAGNOSIS — L304 Erythema intertrigo: Secondary | ICD-10-CM

## 2022-08-17 MED ORDER — NYSTATIN 100000 UNIT/GM EX POWD
1.0000 | Freq: Three times a day (TID) | CUTANEOUS | 1 refills | Status: DC
Start: 2022-08-17 — End: 2022-08-20

## 2022-08-20 MED ORDER — NYSTATIN 100000 UNIT/GM EX CREA
1.0000 | TOPICAL_CREAM | Freq: Two times a day (BID) | CUTANEOUS | 0 refills | Status: DC
Start: 2022-08-20 — End: 2022-08-29

## 2022-08-20 NOTE — Addendum Note (Signed)
Addended by: Mort Sawyers on: 08/20/2022 09:34 AM   Modules accepted: Orders

## 2022-08-29 ENCOUNTER — Encounter: Payer: Self-pay | Admitting: Family Medicine

## 2022-08-29 ENCOUNTER — Ambulatory Visit: Payer: BC Managed Care – PPO | Admitting: Family Medicine

## 2022-08-29 ENCOUNTER — Other Ambulatory Visit: Payer: Self-pay

## 2022-08-29 VITALS — BP 110/74 | HR 90 | Temp 98.5°F | Ht 62.5 in | Wt 167.4 lb

## 2022-08-29 DIAGNOSIS — M542 Cervicalgia: Secondary | ICD-10-CM | POA: Diagnosis not present

## 2022-08-29 DIAGNOSIS — K2100 Gastro-esophageal reflux disease with esophagitis, without bleeding: Secondary | ICD-10-CM
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
## 2022-08-29 NOTE — Telephone Encounter (Signed)
Received a fax refill request from [facility].

## 2022-08-30 ENCOUNTER — Other Ambulatory Visit: Payer: Self-pay | Admitting: Family

## 2022-08-30 DIAGNOSIS — F419 Anxiety disorder, unspecified: Secondary | ICD-10-CM

## 2022-08-31 ENCOUNTER — Other Ambulatory Visit: Payer: Self-pay | Admitting: Family

## 2022-08-31 ENCOUNTER — Encounter: Payer: Self-pay | Admitting: Family Medicine

## 2022-08-31 DIAGNOSIS — K2100 Gastro-esophageal reflux disease with esophagitis, without bleeding: Secondary | ICD-10-CM

## 2022-09-03 ENCOUNTER — Encounter: Payer: Self-pay | Admitting: Family Medicine

## 2022-09-03 MED ORDER — PANTOPRAZOLE SODIUM 20 MG PO TBEC
20.0000 mg | DELAYED_RELEASE_TABLET | Freq: Every day | ORAL | 0 refills | Status: DC
Start: 2022-09-03 — End: 2022-10-05

## 2022-09-04 MED ORDER — PREDNISONE 20 MG PO TABS: ORAL_TABLET | ORAL | 0 refills | Status: DC

## 2022-10-05 ENCOUNTER — Other Ambulatory Visit: Payer: Self-pay | Admitting: Family

## 2022-10-05 DIAGNOSIS — K2100 Gastro-esophageal reflux disease with esophagitis, without bleeding: Secondary | ICD-10-CM

## 2022-10-05 MED ORDER — PANTOPRAZOLE SODIUM 20 MG PO TBEC
20.0000 mg | DELAYED_RELEASE_TABLET | Freq: Every day | ORAL | 2 refills | Status: DC
Start: 2022-10-05 — End: 2023-04-18

## 2022-10-05 NOTE — Telephone Encounter (Signed)
LOV: 06/22/2022 Pending OV: N/A - is to follow up in Dec per last OV instructions Medication Pantoprazole 20mg   Last Refill: 09/03/2022 Qty: #30 with 0 refills

## 2022-10-08 ENCOUNTER — Other Ambulatory Visit: Payer: Self-pay | Admitting: Family

## 2022-10-08 DIAGNOSIS — Z1231 Encounter for screening mammogram for malignant neoplasm of breast: Secondary | ICD-10-CM

## 2022-10-24 ENCOUNTER — Encounter: Payer: Self-pay | Admitting: Gastroenterology

## 2022-10-24 ENCOUNTER — Ambulatory Visit (INDEPENDENT_AMBULATORY_CARE_PROVIDER_SITE_OTHER): Payer: BC Managed Care – PPO | Admitting: Gastroenterology

## 2022-10-24 VITALS — BP 122/86 | HR 73 | Ht 62.0 in | Wt 166.0 lb

## 2022-10-24 DIAGNOSIS — R142 Eructation: Secondary | ICD-10-CM | POA: Insufficient documentation

## 2022-10-24 DIAGNOSIS — K219 Gastro-esophageal reflux disease without esophagitis: Secondary | ICD-10-CM | POA: Diagnosis not present

## 2022-10-24 NOTE — Progress Notes (Signed)
I agree with the assessment and plan as outlined by Ms. Zehr. 
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
## 2022-10-24 NOTE — Patient Instructions (Addendum)
Continue Pantoprazole 20 mg daily 30-60 minutes before breakfast.   Follow up as needed.  _______________________________________________________  If your blood pressure at your visit was 140/90 or greater, please contact your primary care physician to follow up on this.  _______________________________________________________  If you are age 43 or older, your body mass index should be between 23-30. Your Body mass index is 30.36 kg/m. If this is out of the aforementioned range listed, please consider follow up with your Primary Care Provider.  If you are age 58 or younger, your body mass index should be between 19-25. Your Body mass index is 30.36 kg/m. If this is out of the aformentioned range listed, please consider follow up with your Primary Care Provider.   ________________________________________________________  The [facility] providers would like to encourage you to use [facility] to communicate with providers for non-urgent requests or questions.  Due to long hold times on the telephone, sending your provider a message by [facility] may be a faster and more efficient way to get a response.  Please allow 48 business hours for a response.  Please remember that this is for non-urgent requests.  _______________________________________________________

## 2022-11-19 ENCOUNTER — Ambulatory Visit
Admission: RE | Admit: 2022-11-19 | Discharge: 2022-11-19 | Disposition: A | Discharge status: Home / Self Care | Payer: BC Managed Care – PPO | Source: Ambulatory Visit | Attending: Family | Admitting: Family

## 2022-11-19 DIAGNOSIS — Z1231 Encounter for screening mammogram for malignant neoplasm of breast: Secondary | ICD-10-CM | POA: Insufficient documentation

## 2023-01-03 DIAGNOSIS — H8110 Benign paroxysmal vertigo, unspecified ear: Secondary | ICD-10-CM | POA: Diagnosis not present

## 2023-01-03 DIAGNOSIS — M542 Cervicalgia: Secondary | ICD-10-CM | POA: Diagnosis not present

## 2023-01-03 DIAGNOSIS — R519 Headache, unspecified: Secondary | ICD-10-CM | POA: Diagnosis not present

## 2023-01-10 ENCOUNTER — Other Ambulatory Visit: Payer: Self-pay | Admitting: Physician Assistant

## 2023-01-10 DIAGNOSIS — H539 Unspecified visual disturbance: Secondary | ICD-10-CM

## 2023-01-10 DIAGNOSIS — H53149 Visual discomfort, unspecified: Secondary | ICD-10-CM

## 2023-01-10 DIAGNOSIS — R42 Dizziness and giddiness: Secondary | ICD-10-CM

## 2023-01-10 DIAGNOSIS — R519 Headache, unspecified: Secondary | ICD-10-CM

## 2023-01-26 ENCOUNTER — Other Ambulatory Visit: Payer: BC Managed Care – PPO

## 2023-02-21 ENCOUNTER — Other Ambulatory Visit: Payer: Self-pay | Admitting: Family

## 2023-02-21 DIAGNOSIS — F419 Anxiety disorder, unspecified: Secondary | ICD-10-CM

## 2023-04-09 ENCOUNTER — Ambulatory Visit: Payer: Self-pay | Admitting: Family

## 2023-04-18 ENCOUNTER — Encounter: Payer: Self-pay | Admitting: Family

## 2023-04-18 ENCOUNTER — Ambulatory Visit: Payer: Self-pay | Admitting: Family

## 2023-04-18 VITALS — BP 126/86 | HR 93 | Temp 98.3°F | Wt 169.4 lb

## 2023-04-18 DIAGNOSIS — D500 Iron deficiency anemia secondary to blood loss (chronic): Secondary | ICD-10-CM | POA: Diagnosis not present

## 2023-04-18 DIAGNOSIS — M5020 Other cervical disc displacement, unspecified cervical region: Secondary | ICD-10-CM

## 2023-04-18 DIAGNOSIS — R102 Pelvic and perineal pain unspecified side: Secondary | ICD-10-CM | POA: Insufficient documentation

## 2023-04-18 DIAGNOSIS — R1033 Periumbilical pain: Secondary | ICD-10-CM | POA: Insufficient documentation

## 2023-04-18 DIAGNOSIS — F411 Generalized anxiety disorder: Secondary | ICD-10-CM | POA: Diagnosis not present

## 2023-04-18 DIAGNOSIS — R7303 Prediabetes: Secondary | ICD-10-CM

## 2023-04-18 DIAGNOSIS — N946 Dysmenorrhea, unspecified: Secondary | ICD-10-CM

## 2023-04-18 DIAGNOSIS — E7800 Pure hypercholesterolemia, unspecified: Secondary | ICD-10-CM

## 2023-04-18 DIAGNOSIS — Z79899 Other long term (current) drug therapy: Secondary | ICD-10-CM

## 2023-04-18 DIAGNOSIS — N926 Irregular menstruation, unspecified: Secondary | ICD-10-CM | POA: Diagnosis not present

## 2023-04-18 DIAGNOSIS — E282 Polycystic ovarian syndrome: Secondary | ICD-10-CM

## 2023-04-18 DIAGNOSIS — Z30011 Encounter for initial prescription of contraceptive pills: Secondary | ICD-10-CM

## 2023-04-18 DIAGNOSIS — K2100 Gastro-esophageal reflux disease with esophagitis, without bleeding: Secondary | ICD-10-CM | POA: Diagnosis not present

## 2023-04-18 DIAGNOSIS — M542 Cervicalgia: Secondary | ICD-10-CM

## 2023-04-18 DIAGNOSIS — R6882 Decreased libido: Secondary | ICD-10-CM | POA: Diagnosis not present

## 2023-04-18 LAB — COMPREHENSIVE METABOLIC PANEL WITH GFR
ALT: 19 U/L (ref 0–35)
AST: 21 U/L (ref 0–37)
Albumin: 4.7 g/dL (ref 3.5–5.2)
Alkaline Phosphatase: 71 U/L (ref 39–117)
BUN: 10 mg/dL (ref 6–23)
CO2: 30 meq/L (ref 19–32)
Calcium: 10 mg/dL (ref 8.4–10.5)
Chloride: 101 meq/L (ref 96–112)
Creatinine, Ser: 0.93 mg/dL (ref 0.40–1.20)
GFR: 75.19 mL/min (ref >60.00)
Glucose, Bld: 98 mg/dL (ref 70–99)
Potassium: 4.4 meq/L (ref 3.5–5.1)
Sodium: 136 meq/L (ref 135–145)
Total Bilirubin: 0.3 mg/dL (ref 0.2–1.2)
Total Protein: 7.5 g/dL (ref 6.0–8.3)

## 2023-04-18 LAB — LIPID PANEL
Cholesterol: 188 mg/dL (ref 0–200)
HDL: 56.2 mg/dL (ref >39.00)
LDL Cholesterol: 108 mg/dL — ABNORMAL HIGH (ref 0–99)
NonHDL: 131.53
Total CHOL/HDL Ratio: 3
Triglycerides: 118 mg/dL (ref 0.0–149.0)
VLDL: 23.6 mg/dL (ref 0.0–40.0)

## 2023-04-18 LAB — CBC
HCT: 38.3 % (ref 36.0–46.0)
Hemoglobin: 12.6 g/dL (ref 12.0–15.0)
MCHC: 33 g/dL (ref 30.0–36.0)
MCV: 85.8 fl (ref 78.0–100.0)
Platelets: 331 10*3/uL (ref 150.0–400.0)
RBC: 4.46 Mil/uL (ref 3.87–5.11)
RDW: 13.1 % (ref 11.5–15.5)
WBC: 5.9 10*3/uL (ref 4.0–10.5)

## 2023-04-18 LAB — TSH: TSH: 0.9 u[IU]/mL (ref 0.35–5.50)

## 2023-04-18 LAB — FOLLICLE STIMULATING HORMONE: FSH: 3.3 m[IU]/mL

## 2023-04-18 LAB — HEMOGLOBIN A1C: Hgb A1c MFr Bld: 6.3 % (ref 4.6–6.5)

## 2023-04-18 MED ORDER — PANTOPRAZOLE SODIUM 20 MG PO TBEC: 20.0000 mg | DELAYED_RELEASE_TABLET | Freq: Every day | ORAL | 2 refills | Status: DC | PRN

## 2023-04-18 MED ORDER — BUPROPION HCL ER (XL) 150 MG PO TB24: 150.0000 mg | ORAL_TABLET | Freq: Every day | ORAL | 3 refills | Status: AC

## 2023-04-18 MED ORDER — GABAPENTIN 100 MG PO CAPS: 100.0000 mg | ORAL_CAPSULE | Freq: Three times a day (TID) | ORAL | 3 refills | Status: AC

## 2023-04-18 NOTE — Patient Instructions (Addendum)
   I have sent in your order electronically for the following: transvaginal ultrasound and abdominal at this location below. Please call to schedule the appointment at your convenience  [facility] outpatient imaging center off [address], [place] [address] Phone [phone]  8-5 pm    ------------------------------------  Decrease wellbutrin to 150 mg xl once daily.    ------------------------------------ Trial gabapentin  Start one tablet at night time, can increase to twice daily next day and then three times daily next day if tolerating well

## 2023-04-18 NOTE — Assessment & Plan Note (Signed)
 Transvaginal and pelvic u/s ordered R/o uterine fibroids vs ovarian mass or cyst  Irregular menses r/o pcos  Ordering hormonal panel and tsh

## 2023-04-18 NOTE — Progress Notes (Signed)
 Established Patient Office Visit  Subjective:   Patient ID: Tina Schmidt, female    DOB: 11-02-1980  Age: 43 y.o. MRN: [id number]  CC:  Chief Complaint  Patient presents with   Medical Management of Chronic Issues    Pelvic pain on the right side. Feels like her sex drive has dropped.    HPI: Tina Schmidt is a 43 y.o. female presenting on 04/18/2023 for Medical Management of Chronic Issues (Pelvic pain on the right side. Feels like her sex drive has dropped.)  Feeling an 'annoying ache' on the right side of her pelvis. She states she noticed this two weeks ago, it was so bad that she could barely stand up, she states this happens bout every one month. To relive the pain she will press on that area right above her pubic bone. Menses are regular, pretty heavy the first two days with clots (clots are about golf ball size, and has a lot of cramps during that time. Takes ibuprofen every 8 hours during the first few days. She thinks it may be when she is ovulating that she feels this pain. Bowel movements are normal, she takes stool softener with her iron and has been really helpful. Doesn't seem to associate with her bowel movements (the pain). She does report hair on face and some acne at times especially on her back. Having to get her hair lasered on her face.   Other concern is her libido is pretty low, she feels like she should be at her peak and she just doesn't feel it.she is often tired and not really in the mood. She does admit 300 mg wellbutrin XL every other day and at times will miss doses, she states that the 300 mg was a bit strong for her and interferes with sleep.   Intermittent cervicalgia, cervical spine MRI with multilevel bulging discs with partially empty sella. She has completed physical therapy, and was given exercises to do at home. Last week again started with a flare on the left side of her neck,  and has lasted for about one week. She will get intermittent episodes  ranging from 2 days to a week about every 1-2 months. She takes flexeril but can't take this during daytime due to drowsiness.       ROS: Negative unless specifically indicated above in HPI.   Relevant past medical history reviewed and updated as indicated.   Allergies and medications reviewed and updated.   Current Outpatient Medications:    atorvastatin (LIPITOR) 10 MG tablet, Take 1 tablet by mouth daily., Disp: 90 tablet, Rfl: 3   buPROPion (WELLBUTRIN XL) 150 MG 24 hr tablet, Take 1 tablet (150 mg total) by mouth daily., Disp: 90 tablet, Rfl: 3   Cholecalciferol (VITAMIN D3) 50 MCG (2000 UT) TABS, Take 1 tablet by mouth daily., Disp: , Rfl:    clobetasol (TEMOVATE) 0.05 % external solution, PRN, Disp: , Rfl:    cyclobenzaprine (FLEXERIL) 10 MG tablet, Take 1 tablet (10 mg total) by mouth 3 (three) times daily as needed for muscle spasms., Disp: 30 tablet, Rfl: 0   ferrous sulfate 325 (65 FE) MG tablet, Take 325 mg by mouth daily with breakfast., Disp: , Rfl:    gabapentin (NEURONTIN) 100 MG capsule, Take 1 capsule (100 mg total) by mouth 3 (three) times daily., Disp: 90 capsule, Rfl: 3   hydrocortisone (ANUSOL-HC) 2.5 % rectal cream, Place 1 Application rectally 2 (two) times daily as needed for hemorrhoids or anal  itching., Disp: , Rfl:    ketoconazole (NIZORAL) 2 % shampoo, Apply 1 Application topically. 2 times weekly as needed, Disp: , Rfl:    nystatin cream (MYCOSTATIN), Apply 1 Application topically 2 (two) times daily as needed for dry skin., Disp: , Rfl:    predniSONE (DELTASONE) 20 MG tablet, 2 tabs po daily for 5 days, then 1 tab po daily for 5 days, Disp: 15 tablet, Rfl: 0   pantoprazole (PROTONIX) 20 MG tablet, Take 1 tablet (20 mg total) by mouth daily as needed., Disp: 30 tablet, Rfl: 2  No Known Allergies  Objective:   BP 126/86 (BP Location: Left Arm, Patient Position: Sitting, Cuff Size: Normal)   Pulse 93   Temp 98.3 F (36.8 C) (Temporal)   Wt 169 lb 6.4 oz  (76.8 kg)   LMP 03/26/2023 (Exact Date)   SpO2 96%   BMI 30.98 kg/m    Physical Exam Vitals reviewed.  Constitutional:      General: She is not in acute distress.    Appearance: Normal appearance. She is normal weight. She is not ill-appearing, toxic-appearing or diaphoretic.  HENT:     Head: Normocephalic.  Cardiovascular:     Rate and Rhythm: Normal rate and regular rhythm.  Pulmonary:     Effort: Pulmonary effort is normal.     Breath sounds: Normal breath sounds.  Abdominal:     Tenderness: There is abdominal tenderness in the suprapubic area. Rebound: right lower.     Hernia: A hernia (suspected) is present. Hernia is present in the umbilical area.  Musculoskeletal:        General: Normal range of motion.  Neurological:     General: No focal deficit present.     Mental Status: She is alert and oriented to person, place, and time. Mental status is at baseline.  Psychiatric:        Mood and Affect: Mood normal.        Behavior: Behavior normal.        Thought Content: Thought content normal.        Judgment: Judgment normal.     Assessment & Plan:  Pelvic pain Assessment & Plan: Transvaginal and pelvic u/s ordered R/o uterine fibroids vs ovarian mass or cyst  Irregular menses r/o pcos  Ordering hormonal panel and tsh  Orders: -     US PELVIC COMPLETE WITH TRANSVAGINAL; Future -     Follicle stimulating hormone -     TSH -     Prolactin -     Testos,Total,Free and SHBG (Female)  Menstrual pain -     US PELVIC COMPLETE WITH TRANSVAGINAL; Future -     Follicle stimulating hormone -     TSH -     Prolactin -     Testos,Total,Free and SHBG (Female)  Gastroesophageal reflux disease with esophagitis without hemorrhage Assessment & Plan: Try to decrease and or avoid spicy foods, fried fatty foods, and also caffeine and chocolate as these can increase heartburn symptoms.   Pt only taking ppi prn only about two times a week.   Orders: -     Pantoprazole Sodium;  Take 1 tablet (20 mg total) by mouth daily as needed.  Dispense: 30 tablet; Refill: 2  Generalized anxiety disorder Assessment & Plan: D/w pt on compliance, may be causing her libido changes.  Go back to 150 mg xl once daily and be consistent to see if this helps.   Orders: -     buPROPion  HCl ER (XL); Take 1 tablet (150 mg total) by mouth daily.  Dispense: 90 tablet; Refill: 3  Decreased libido -     buPROPion HCl ER (XL); Take 1 tablet (150 mg total) by mouth daily.  Dispense: 90 tablet; Refill: 3 -     Follicle stimulating hormone -     TSH -     Prolactin -     Testos,Total,Free and SHBG (Female)  Prediabetes Assessment & Plan: Ordering A1c pending results Pt advised of the following: Work on a diabetic diet, try to incorporate exercise at least 20-30 a day for 3 days a week or more.    Orders: -     Hemoglobin A1c -     Comprehensive metabolic panel with GFR  On statin therapy -     Comprehensive metabolic panel with GFR  Hypercholesterolemia -     Lipid panel  Iron deficiency anemia due to chronic blood loss -     CBC -     IBC + Ferritin; Future  Cervical herniated disc -     Gabapentin; Take 1 capsule (100 mg total) by mouth 3 (three) times daily.  Dispense: 90 capsule; Refill: 3  Cervicalgia Assessment & Plan: Trial gabapentin 100 mg tid  Discussed titration   Orders: -     Gabapentin; Take 1 capsule (100 mg total) by mouth 3 (three) times daily.  Dispense: 90 capsule; Refill: 3  Umbilical pain Assessment & Plan: Suspect paraumbilical hernia Ordering abd u/s Discussed red flag precautions  Orders: -     US Abdomen Limited; Future     Follow up plan: Return if symptoms worsen or fail to improve.  Mort Sawyers, FNP

## 2023-04-18 NOTE — Assessment & Plan Note (Signed)
 Ordering A1c pending results Pt advised of the following: Work on a diabetic diet, try to incorporate exercise at least 20-30 a day for 3 days a week or more.

## 2023-04-18 NOTE — Assessment & Plan Note (Signed)
 D/w pt on compliance, may be causing her libido changes.  Go back to 150 mg xl once daily and be consistent to see if this helps.

## 2023-04-18 NOTE — Assessment & Plan Note (Signed)
 Trial gabapentin 100 mg tid  Discussed titration

## 2023-04-18 NOTE — Assessment & Plan Note (Signed)
 Suspect paraumbilical hernia Ordering abd u/s Discussed red flag precautions

## 2023-04-18 NOTE — Assessment & Plan Note (Signed)
 Try to decrease and or avoid spicy foods, fried fatty foods, and also caffeine and chocolate as these can increase heartburn symptoms.   Pt only taking ppi prn only about two times a week.

## 2023-04-19 ENCOUNTER — Encounter: Payer: Self-pay | Admitting: Family

## 2023-04-22 ENCOUNTER — Ambulatory Visit
Admission: RE | Admit: 2023-04-22 | Discharge: 2023-04-22 | Disposition: A | Discharge status: Home / Self Care | Source: Ambulatory Visit | Attending: Family

## 2023-04-22 ENCOUNTER — Ambulatory Visit
Admission: RE | Admit: 2023-04-22 | Discharge: 2023-04-22 | Disposition: A | Discharge status: Home / Self Care | Source: Ambulatory Visit | Attending: Family | Admitting: Family

## 2023-04-22 DIAGNOSIS — N946 Dysmenorrhea, unspecified: Secondary | ICD-10-CM | POA: Insufficient documentation

## 2023-04-22 DIAGNOSIS — R102 Pelvic and perineal pain: Secondary | ICD-10-CM | POA: Insufficient documentation

## 2023-04-22 DIAGNOSIS — R1033 Periumbilical pain: Secondary | ICD-10-CM

## 2023-04-22 DIAGNOSIS — N8300 Follicular cyst of ovary, unspecified side: Secondary | ICD-10-CM | POA: Diagnosis not present

## 2023-04-22 MED ORDER — METFORMIN HCL 500 MG PO TABS: 500.0000 mg | ORAL_TABLET | Freq: Two times a day (BID) | ORAL | 3 refills | Status: DC

## 2023-04-23 ENCOUNTER — Encounter: Payer: Self-pay | Admitting: Family

## 2023-04-24 LAB — TESTOS,TOTAL,FREE AND SHBG (FEMALE)
Free Testosterone: 8 pg/mL — ABNORMAL HIGH (ref 0.1–6.4)
Sex Hormone Binding: 21.2 nmol/L (ref 17–124)
Testosterone, Total, LC-MS-MS: 49 ng/dL — ABNORMAL HIGH (ref 2–45)

## 2023-04-24 LAB — PROLACTIN: Prolactin: 10.1 ng/mL

## 2023-04-25 ENCOUNTER — Encounter: Payer: Self-pay | Admitting: Family

## 2023-04-25 DIAGNOSIS — E282 Polycystic ovarian syndrome: Secondary | ICD-10-CM | POA: Insufficient documentation

## 2023-04-26 MED ORDER — NORETHIN ACE-ETH ESTRAD-FE 1-20 MG-MCG PO TABS: 1.0000 | ORAL_TABLET | Freq: Every day | ORAL | 11 refills | Status: DC

## 2023-04-26 NOTE — Addendum Note (Signed)
 Addended by: Mort Sawyers on: 04/26/2023 07:01 AM   Modules accepted: Orders

## 2023-06-13 DIAGNOSIS — G8929 Other chronic pain: Secondary | ICD-10-CM | POA: Diagnosis not present

## 2023-06-13 DIAGNOSIS — R1031 Right lower quadrant pain: Secondary | ICD-10-CM | POA: Diagnosis not present

## 2023-06-19 ENCOUNTER — Ambulatory Visit: Admitting: Family

## 2023-06-19 VITALS — BP 132/76 | HR 76 | Temp 97.6°F | Ht 62.0 in | Wt 170.0 lb

## 2023-06-19 DIAGNOSIS — R311 Benign essential microscopic hematuria: Secondary | ICD-10-CM | POA: Diagnosis not present

## 2023-06-19 DIAGNOSIS — R350 Frequency of micturition: Secondary | ICD-10-CM

## 2023-06-19 DIAGNOSIS — R110 Nausea: Secondary | ICD-10-CM | POA: Insufficient documentation

## 2023-06-19 DIAGNOSIS — E282 Polycystic ovarian syndrome: Secondary | ICD-10-CM

## 2023-06-19 LAB — POC URINALSYSI DIPSTICK (AUTOMATED)
Bilirubin, UA: NEGATIVE
Glucose, UA: NEGATIVE
Ketones, UA: NEGATIVE
Leukocytes, UA: NEGATIVE
Nitrite, UA: NEGATIVE
Protein, UA: NEGATIVE
Spec Grav, UA: 1.02 (ref 1.010–1.025)
Urobilinogen, UA: 0.2 U/dL
pH, UA: 5.5 (ref 5.0–8.0)

## 2023-06-19 MED ORDER — METFORMIN HCL ER 500 MG PO TB24: 500.0000 mg | ORAL_TABLET | Freq: Two times a day (BID) | ORAL | 2 refills | Status: DC

## 2023-06-19 MED ORDER — ONDANSETRON HCL 4 MG PO TABS: 4.0000 mg | ORAL_TABLET | Freq: Three times a day (TID) | ORAL | 0 refills | Status: AC | PRN

## 2023-06-19 NOTE — Assessment & Plan Note (Signed)
 Repeat poc dip today pending results.  Also with frequency of urine so will evaluate if any leukocytes will u/a culture

## 2023-06-19 NOTE — Progress Notes (Signed)
 Established Patient Office Visit  Subjective:   Patient ID: Tina Schmidt, female    DOB: [birth year]  Age: 43 y.o. MRN: [id number]  CC:  Chief Complaint  Patient presents with   Medical Management of Chronic Issues    Right side pain started 5/23 after pcos.    Medication Problem    Having issues with metformin      HPI: Tina Schmidt is a 43 y.o. female presenting on 06/19/2023 for Medical Management of Chronic Issues (Right side pain started 5/23 after pcos. ) and Medication Problem (Having issues with metformin  )  Menses have been irregular however she has since started OCP. She states she started spotting on the 13th of may. She is due for her sugar pills next week. Shew as concerned because she had right sided pelvic pain that lasted for about one week that was aching throughout the week, when she had applied pressure it felt better. No chance of pregnancy, she does have h/o tubal ligation.  She does note some increased urinary frequency.   She did also start metformin  but it is causing her a lot of diarrhea.  No fever.   She did have her coworker DO order some labs 5/29 for reassurance because of the pain and they came back unremarkable. Cbc normal limits. Urine was with trace of blood.  Sed rate with some slight elevation at 47 but crp was negative.     ROS: Negative unless specifically indicated above in HPI.   Relevant past medical history reviewed and updated as indicated.   Allergies and medications reviewed and updated.   Current Outpatient Medications:    atorvastatin  (LIPITOR) 10 MG tablet, Take 1 tablet by mouth daily., Disp: 90 tablet, Rfl: 3   buPROPion  (WELLBUTRIN  XL) 150 MG 24 hr tablet, Take 1 tablet (150 mg total) by mouth daily., Disp: 90 tablet, Rfl: 3   Cholecalciferol (VITAMIN D3) 50 MCG (2000 UT) TABS, Take 1 tablet by mouth daily., Disp: , Rfl:    clobetasol (TEMOVATE) 0.05 % external solution, PRN, Disp: , Rfl:    cyclobenzaprine  (FLEXERIL ) 10  MG tablet, Take 1 tablet (10 mg total) by mouth 3 (three) times daily as needed for muscle spasms., Disp: 30 tablet, Rfl: 0   ferrous sulfate 325 (65 FE) MG tablet, Take 325 mg by mouth daily with breakfast., Disp: , Rfl:    gabapentin  (NEURONTIN ) 100 MG capsule, Take 1 capsule (100 mg total) by mouth 3 (three) times daily., Disp: 90 capsule, Rfl: 3   hydrocortisone  (ANUSOL -HC) 2.5 % rectal cream, Place 1 Application rectally 2 (two) times daily as needed for hemorrhoids or anal itching., Disp: , Rfl:    ketoconazole  (NIZORAL ) 2 % shampoo, Apply 1 Application topically. 2 times weekly as needed, Disp: , Rfl:    metFORMIN  (GLUCOPHAGE -XR) 500 MG 24 hr tablet, Take 1 tablet (500 mg total) by mouth 2 (two) times daily with a meal., Disp: 60 tablet, Rfl: 2   norethindrone-ethinyl estradiol-FE (JUNEL FE 1/20) 1-20 MG-MCG tablet, Take 1 tablet by mouth daily., Disp: 28 tablet, Rfl: 11   nystatin  cream (MYCOSTATIN ), Apply 1 Application topically 2 (two) times daily as needed for dry skin., Disp: , Rfl:    ondansetron (ZOFRAN) 4 MG tablet, Take 1 tablet (4 mg total) by mouth every 8 (eight) hours as needed for nausea or vomiting., Disp: 20 tablet, Rfl: 0   pantoprazole  (PROTONIX ) 20 MG tablet, Take 1 tablet (20 mg total) by mouth daily as needed., Disp: 30  tablet, Rfl: 2  No Known Allergies  Objective:   BP 132/76   Pulse 76   Temp 97.6 F (36.4 C) (Oral)   Ht 5\' 2"  (1.575 m)   Wt 170 lb (77.1 kg)   LMP 05/28/2023   SpO2 98%   BMI 31.09 kg/m    Physical Exam Vitals reviewed.  Constitutional:      General: She is not in acute distress.    Appearance: Normal appearance. She is normal weight. She is not ill-appearing, toxic-appearing or diaphoretic.  HENT:     Head: Normocephalic.  Cardiovascular:     Rate and Rhythm: Normal rate.  Pulmonary:     Effort: Pulmonary effort is normal.  Abdominal:     General: Bowel sounds are increased. There is distension.     Palpations: Abdomen is soft.      Tenderness: There is abdominal tenderness (left lower moderate) in the right lower quadrant and left lower quadrant. There is no guarding or rebound. Negative signs include Rovsing's sign and psoas sign.  Musculoskeletal:        General: Normal range of motion.  Neurological:     General: No focal deficit present.     Mental Status: She is alert and oriented to person, place, and time. Mental status is at baseline.  Psychiatric:        Mood and Affect: Mood normal.        Behavior: Behavior normal.        Thought Content: Thought content normal.        Judgment: Judgment normal.     Assessment & Plan:  PCOS (polycystic ovarian syndrome) Assessment & Plan: Metformin  likely causing diarrhea Will stop for now and then restart once daily with XR.  Advised pt again starts to have diarrhea stop and we will d/c and add to intolerance list.   Advised the pelvic pain could be from ovulation, good that it is improving.  Monitor, and if fever, severe abdominal pain then let me know and advised of er precautions.  Reviewed transvaginal u/s   Could consider re ultrasound if pain persists.   Orders: -     metFORMIN  HCl ER; Take 1 tablet (500 mg total) by mouth 2 (two) times daily with a meal.  Dispense: 60 tablet; Refill: 2  Nausea Assessment & Plan: Rx zofran prn suspect metformin   Orders: -     Ondansetron HCl; Take 1 tablet (4 mg total) by mouth every 8 (eight) hours as needed for nausea or vomiting.  Dispense: 20 tablet; Refill: 0  Urinary frequency -     POCT Urinalysis Dipstick (Automated)  Benign essential microscopic hematuria Assessment & Plan: Repeat poc dip today pending results.  Also with frequency of urine so will evaluate if any leukocytes will u/a culture       Follow up plan: Return if symptoms worsen or fail to improve.  Felicita Horns, FNP

## 2023-06-19 NOTE — Assessment & Plan Note (Addendum)
 Metformin  likely causing diarrhea Will stop for now and then restart once daily with XR.  Advised pt again starts to have diarrhea stop and we will d/c and add to intolerance list.   Advised the pelvic pain could be from ovulation, good that it is improving.  Monitor, and if fever, severe abdominal pain then let me know and advised of er precautions.  Reviewed transvaginal u/s   Could consider re ultrasound if pain persists.

## 2023-06-19 NOTE — Assessment & Plan Note (Signed)
 Rx zofran prn suspect metformin 

## 2023-06-22 ENCOUNTER — Other Ambulatory Visit: Payer: Self-pay | Admitting: Family

## 2023-06-22 DIAGNOSIS — E7800 Pure hypercholesterolemia, unspecified: Secondary | ICD-10-CM

## 2023-07-08 ENCOUNTER — Encounter: Payer: Self-pay | Admitting: Family

## 2023-07-08 DIAGNOSIS — E663 Overweight: Secondary | ICD-10-CM

## 2023-07-08 DIAGNOSIS — E7800 Pure hypercholesterolemia, unspecified: Secondary | ICD-10-CM

## 2023-07-08 DIAGNOSIS — R7303 Prediabetes: Secondary | ICD-10-CM

## 2023-07-11 ENCOUNTER — Encounter (INDEPENDENT_AMBULATORY_CARE_PROVIDER_SITE_OTHER): Payer: Self-pay

## 2023-07-18 ENCOUNTER — Other Ambulatory Visit: Payer: Self-pay | Admitting: Family

## 2023-07-18 DIAGNOSIS — K2100 Gastro-esophageal reflux disease with esophagitis, without bleeding: Secondary | ICD-10-CM

## 2023-07-22 MED ORDER — PANTOPRAZOLE SODIUM 20 MG PO TBEC: 20.0000 mg | DELAYED_RELEASE_TABLET | Freq: Every day | ORAL | 2 refills | Status: AC | PRN

## 2023-08-04 ENCOUNTER — Encounter: Payer: Self-pay | Admitting: Family

## 2023-08-04 DIAGNOSIS — N8300 Follicular cyst of ovary, unspecified side: Secondary | ICD-10-CM

## 2023-08-04 DIAGNOSIS — R102 Pelvic and perineal pain: Secondary | ICD-10-CM

## 2023-08-04 DIAGNOSIS — N83201 Unspecified ovarian cyst, right side: Secondary | ICD-10-CM

## 2023-08-13 ENCOUNTER — Encounter: Payer: Self-pay | Admitting: Family

## 2023-08-15 ENCOUNTER — Encounter (INDEPENDENT_AMBULATORY_CARE_PROVIDER_SITE_OTHER): Payer: Self-pay | Admitting: *Deleted

## 2023-08-15 ENCOUNTER — Ambulatory Visit: Admitting: Family

## 2023-08-22 ENCOUNTER — Encounter: Payer: Self-pay | Admitting: Family

## 2023-08-22 DIAGNOSIS — E663 Overweight: Secondary | ICD-10-CM

## 2023-08-22 DIAGNOSIS — R7303 Prediabetes: Secondary | ICD-10-CM

## 2023-08-22 DIAGNOSIS — E7800 Pure hypercholesterolemia, unspecified: Secondary | ICD-10-CM

## 2023-08-22 DIAGNOSIS — E282 Polycystic ovarian syndrome: Secondary | ICD-10-CM

## 2023-08-26 ENCOUNTER — Telehealth: Payer: Self-pay

## 2023-08-26 MED ORDER — ZEPBOUND 2.5 MG/0.5ML ~~LOC~~ SOAJ: 2.5000 mg | SUBCUTANEOUS | 0 refills | Status: DC

## 2023-08-26 MED ORDER — ZEPBOUND 7.5 MG/0.5ML ~~LOC~~ SOAJ: 7.5000 mg | SUBCUTANEOUS | 1 refills | Status: DC

## 2023-08-26 MED ORDER — ZEPBOUND 5 MG/0.5ML ~~LOC~~ SOAJ: 5.0000 mg | SUBCUTANEOUS | 0 refills | Status: DC

## 2023-08-26 NOTE — Telephone Encounter (Signed)
 Pharmacy Patient Advocate Encounter  Received notification from [employer] that Prior Authorization for Zepbound  2.5 has been DENIED.  See denial reason below. No denial letter attached in CMM. Will attach denial letter to Media tab once received.     PA #/Case ID/Reference #:  [id number]

## 2023-08-26 NOTE — Telephone Encounter (Signed)
 Pharmacy Patient Advocate Encounter   Received notification from Pt Calls Messages that prior authorization for Zepbound  2.5 is required/requested.   Insurance verification completed.   The patient is insured through [facility] .   Per test claim: [id number]

## 2023-08-26 NOTE — Telephone Encounter (Signed)
 Can we send prior auth for zepbound 

## 2023-08-26 NOTE — Addendum Note (Signed)
 Addended by: CORWIN ANTU on: 08/26/2023 07:21 AM   Modules accepted: Orders

## 2023-09-19 NOTE — Progress Notes (Deleted)
 Corwin Antu, FNP   No chief complaint on file.   HPI:      Ms. Tina Schmidt is a 43 y.o. No obstetric history on file. whose LMP was No LMP recorded., presents today for NP eval pelvic pain.  Was on OCPs at one time??? 04/22/23 GYN u/s: Neg pap/neg HPV DNA 9/23  FINDINGS: Uterusanteverted, 8 x 5 x 4 cm. The endometrium unremarkable, 0.7 cm. The uterine cavity is empty. There are no uterine masses.  Right ovary--Septated cyst measures 1.9 cm consistent with adjacent follicles. Ovary measurements 3.5 x 2.3 x 2.2 cm.  Left ovary--Dominant follicular cyst measures 2.4 cm. Ovary measurements 3.7 x 2.7 x 2.6 cm.    Patient Active Problem List   Diagnosis Date Noted   Nausea 06/19/2023   Benign essential microscopic hematuria 06/19/2023   PCOS (polycystic ovarian syndrome) 04/25/2023   Decreased libido 04/18/2023   Gastroesophageal reflux disease with esophagitis without hemorrhage 06/22/2022   Drug-induced constipation 06/22/2022   On statin therapy 01/31/2022   Prediabetes 10/02/2021   Overweight (BMI 25.0-29.9) 10/02/2021   Grade I hemorrhoids 06/02/2021   Cervical herniated disc 03/13/2021   Cervicalgia 03/13/2021   Generalized anxiety disorder 02/02/2021   Vitamin D  deficiency 12/19/2020   Hypercholesterolemia    Intertrigo    Seborrheic dermatitis of scalp    Iron deficiency anemia    Hirsutism 06/10/2019    Past Surgical History:  Procedure Laterality Date   CESAREAN SECTION     07/2004, 11/2007, 08/2012   CHOLECYSTECTOMY     REFRACTIVE SURGERY Bilateral    TUBAL LIGATION  08/26/2012    Family History  Problem Relation Age of Onset   Asthma Mother    Hyperlipidemia Mother    Hypertension Mother    Irritable bowel syndrome Mother    Hypertension Father    Liver cancer Maternal Grandmother    Diabetes Paternal Grandmother    Colon cancer Neg Hx    Esophageal cancer Neg Hx    Esophagitis Neg Hx     Social History   Socioeconomic History    Marital status: Married    Spouse name: Not on file   Number of children: 3   Years of education: Not on file   Highest education level: Associate degree: occupational, Scientist, product/process development, or vocational program  Occupational History   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   Vaping Use   Vaping status: Never Used  Substance and Sexual Activity   Alcohol use: Not Currently    Alcohol/week: 2.0 standard drinks of alcohol    Types: 2 Glasses of wine per week    Comment: occ   Drug use: No   Sexual activity: Yes    Partners: Male    Birth control/protection: Surgical  Other Topics Concern   Not on file  Social History Narrative   Patient is married and has 3 sons. 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    Social Drivers of Corporate investment banker Strain: Low Risk  (04/17/2023)   Overall Financial Resource Strain (CARDIA)    Difficulty of Paying Living Expenses: Not hard at all  Food Insecurity: No Food Insecurity (04/17/2023)   Hunger Vital Sign    Worried About Running Out of Food in the Last  Year: Never true    Ran Out of Food in the Last Year: Never true  Transportation Needs: No Transportation Needs (04/17/2023)   PRAPARE - Administrator, Civil Service (Medical): No    Lack of Transportation (Non-Medical): No  Physical Activity: Sufficiently Active (04/17/2023)   Exercise Vital Sign    Days of Exercise per Week: 5 days    Minutes of Exercise per Session: 30 min  Stress: No Stress Concern Present (04/17/2023)   [employer] of Occupational Health - Occupational Stress Questionnaire    Feeling of Stress : Only a little  Social Connections: Moderately Integrated (04/17/2023)   Social Connection and Isolation Panel    Frequency of Communication with Friends and Family: More than three times a week     Frequency of Social Gatherings with Friends and Family: Once a week    Attends Religious Services: More than 4 times per year    Active Member of [employer] or Organizations: No    Attends Engineer, structural: Not on file    Marital Status: Married  Catering manager Violence: Not on file    Outpatient Medications Prior to Visit  Medication Sig Dispense Refill   atorvastatin  (LIPITOR) 10 MG tablet Take 1 tablet by mouth daily. 90 tablet 2   buPROPion  (WELLBUTRIN  XL) 150 MG 24 hr tablet Take 1 tablet (150 mg total) by mouth daily. 90 tablet 3   Cholecalciferol (VITAMIN D3) 50 MCG (2000 UT) TABS Take 1 tablet by mouth daily.     clobetasol (TEMOVATE) 0.05 % external solution PRN     cyclobenzaprine  (FLEXERIL ) 10 MG tablet Take 1 tablet (10 mg total) by mouth 3 (three) times daily as needed for muscle spasms. 30 tablet 0   ferrous sulfate 325 (65 FE) MG tablet Take 325 mg by mouth daily with breakfast.     gabapentin  (NEURONTIN ) 100 MG capsule Take 1 capsule (100 mg total) by mouth 3 (three) times daily. 90 capsule 3   hydrocortisone  (ANUSOL -HC) 2.5 % rectal cream Place 1 Application rectally 2 (two) times daily as needed for hemorrhoids or anal itching.     ketoconazole  (NIZORAL ) 2 % shampoo Apply 1 Application topically. 2 times weekly as needed     nystatin  cream (MYCOSTATIN ) Apply 1 Application topically 2 (two) times daily as needed for dry skin.     ondansetron  (ZOFRAN ) 4 MG tablet Take 1 tablet (4 mg total) by mouth every 8 (eight) hours as needed for nausea or vomiting. 20 tablet 0   pantoprazole  (PROTONIX ) 20 MG tablet Take 1 tablet (20 mg total) by mouth daily as needed. 30 tablet 2   tirzepatide  (ZEPBOUND ) 2.5 MG/0.5ML Pen Inject 2.5 mg into the skin once a week. Inject 0.25 mg [place] weekly for four weeks, then increase to 0.5 mg weekly 2 mL 0   tirzepatide  (ZEPBOUND ) 5 MG/0.5ML Pen Inject 5 mg into the skin once a week. Inject 0.5 mg [place] weekly 2 mL 0   [START ON 10/07/2023]  tirzepatide  (ZEPBOUND ) 7.5 MG/0.5ML Pen Inject 7.5 mg into the skin once a week. 2 mL 1   No facility-administered medications prior to visit.      ROS:  Review of Systems BREAST: No symptoms   OBJECTIVE:   Vitals:  There were no vitals taken for this visit.  Physical Exam  Results: No results found for this or any previous visit (from the past 24 hours).   Assessment/Plan: No diagnosis found.    No orders of  the defined types were placed in this encounter.     No follow-ups on file.  Ariannie Penaloza B. Edilberto Roosevelt, PA-C 09/19/2023 2:39 PM

## 2023-09-23 ENCOUNTER — Encounter: Admitting: Obstetrics and Gynecology

## 2023-09-23 DIAGNOSIS — R102 Pelvic and perineal pain: Secondary | ICD-10-CM

## 2023-09-30 ENCOUNTER — Encounter: Payer: Self-pay | Admitting: Family

## 2023-09-30 DIAGNOSIS — R102 Pelvic and perineal pain: Secondary | ICD-10-CM

## 2023-09-30 DIAGNOSIS — E282 Polycystic ovarian syndrome: Secondary | ICD-10-CM

## 2023-09-30 DIAGNOSIS — N8300 Follicular cyst of ovary, unspecified side: Secondary | ICD-10-CM

## 2023-09-30 DIAGNOSIS — N83201 Unspecified ovarian cyst, right side: Secondary | ICD-10-CM

## 2023-10-03 ENCOUNTER — Ambulatory Visit
Admission: RE | Admit: 2023-10-03 | Discharge: 2023-10-03 | Disposition: A | Discharge status: Home / Self Care | Source: Ambulatory Visit | Attending: Family | Admitting: Family

## 2023-10-03 ENCOUNTER — Ambulatory Visit: Admitting: Family

## 2023-10-03 DIAGNOSIS — R102 Pelvic and perineal pain: Secondary | ICD-10-CM | POA: Insufficient documentation

## 2023-10-03 DIAGNOSIS — N858 Other specified noninflammatory disorders of uterus: Secondary | ICD-10-CM | POA: Diagnosis not present

## 2023-10-03 DIAGNOSIS — E282 Polycystic ovarian syndrome: Secondary | ICD-10-CM | POA: Insufficient documentation

## 2023-10-03 DIAGNOSIS — N854 Malposition of uterus: Secondary | ICD-10-CM | POA: Diagnosis not present

## 2023-10-03 DIAGNOSIS — N8300 Follicular cyst of ovary, unspecified side: Secondary | ICD-10-CM | POA: Diagnosis not present

## 2023-10-03 DIAGNOSIS — N83201 Unspecified ovarian cyst, right side: Secondary | ICD-10-CM | POA: Diagnosis not present

## 2023-10-11 ENCOUNTER — Ambulatory Visit: Payer: Self-pay | Admitting: Family

## 2023-10-30 NOTE — Progress Notes (Unsigned)
 Corwin Antu, FNP   No chief complaint on file.   HPI:      Ms. Tina Schmidt is a 43 y.o. No obstetric history on file. whose LMP was No LMP recorded., presents today for *** Has cramping RTO cyst resolved on 9/25 u/s, initially noted on 4/25 u/s  Patient Active Problem List   Diagnosis Date Noted   Nausea 06/19/2023   Benign essential microscopic hematuria 06/19/2023   PCOS (polycystic ovarian syndrome) 04/25/2023   Decreased libido 04/18/2023   Gastroesophageal reflux disease with esophagitis without hemorrhage 06/22/2022   Drug-induced constipation 06/22/2022   On statin therapy 01/31/2022   Prediabetes 10/02/2021   Overweight (BMI 25.0-29.9) 10/02/2021   Grade I hemorrhoids 06/02/2021   Cervical herniated disc 03/13/2021   Cervicalgia 03/13/2021   Generalized anxiety disorder 02/02/2021   Vitamin D  deficiency 12/19/2020   Hypercholesterolemia    Intertrigo    Seborrheic dermatitis of scalp    Iron deficiency anemia    Hirsutism 06/10/2019    Past Surgical History:  Procedure Laterality Date   CESAREAN SECTION     07/2004, 11/2007, 08/2012   CHOLECYSTECTOMY     REFRACTIVE SURGERY Bilateral    TUBAL LIGATION  08/26/2012    Family History  Problem Relation Age of Onset   Asthma Mother    Hyperlipidemia Mother    Hypertension Mother    Irritable bowel syndrome Mother    Hypertension Father    Liver cancer Maternal Grandmother    Diabetes Paternal Grandmother    Colon cancer Neg Hx    Esophageal cancer Neg Hx    Esophagitis Neg Hx     Social History   Socioeconomic History   Marital status: Married    Spouse name: Not on file   Number of children: 3   Years of education: Not on file   Highest education level: Associate degree: occupational, Scientist, product/process development, or vocational program  Occupational History   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   Vaping Use   Vaping status: Never Used  Substance and Sexual Activity   Alcohol use: Not Currently    Alcohol/week: 2.0 standard drinks of alcohol    Types: 2 Glasses of wine per week    Comment: occ   Drug use: No   Sexual activity: Yes    Partners: Male    Birth control/protection: Surgical  Other Topics Concern   Not on file  Social History Narrative   Patient is married and has 3 sons. 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    Social Drivers of Corporate investment banker Strain: Low Risk  (04/17/2023)   Overall Financial Resource Strain (CARDIA)    Difficulty of Paying Living Expenses: Not hard at all  Food Insecurity: No Food Insecurity (04/17/2023)   Hunger Vital Sign    Worried About Running Out of Food in the Last Year: Never true    Ran Out of Food in the Last Year: Never true  Transportation Needs: No Transportation Needs (04/17/2023)   PRAPARE - Administrator, Civil Service (Medical): No    Lack of Transportation (Non-Medical): No  Physical Activity: Sufficiently Active (04/17/2023)   Exercise Vital Sign    Days of Exercise per Week: 5 days  Minutes of Exercise per Session: 30 min  Stress: No Stress Concern Present (04/17/2023)   [employer] of Occupational Health - Occupational Stress Questionnaire    Feeling of Stress : Only a little  Social Connections: Moderately Integrated (04/17/2023)   Social Connection and Isolation Panel    Frequency of Communication with Friends and Family: More than three times a week    Frequency of Social Gatherings with Friends and Family: Once a week    Attends Religious Services: More than 4 times per year    Active Member of [employer] or Organizations: No    Attends Engineer, structural: Not on file    Marital Status: Married  Catering manager Violence: Not on file    Outpatient Medications Prior to Visit  Medication Sig Dispense Refill    atorvastatin  (LIPITOR) 10 MG tablet Take 1 tablet by mouth daily. 90 tablet 2   buPROPion  (WELLBUTRIN  XL) 150 MG 24 hr tablet Take 1 tablet (150 mg total) by mouth daily. 90 tablet 3   Cholecalciferol (VITAMIN D3) 50 MCG (2000 UT) TABS Take 1 tablet by mouth daily.     clobetasol (TEMOVATE) 0.05 % external solution PRN     cyclobenzaprine  (FLEXERIL ) 10 MG tablet Take 1 tablet (10 mg total) by mouth 3 (three) times daily as needed for muscle spasms. 30 tablet 0   ferrous sulfate 325 (65 FE) MG tablet Take 325 mg by mouth daily with breakfast.     gabapentin  (NEURONTIN ) 100 MG capsule Take 1 capsule (100 mg total) by mouth 3 (three) times daily. 90 capsule 3   hydrocortisone  (ANUSOL -HC) 2.5 % rectal cream Place 1 Application rectally 2 (two) times daily as needed for hemorrhoids or anal itching.     ketoconazole  (NIZORAL ) 2 % shampoo Apply 1 Application topically. 2 times weekly as needed     nystatin  cream (MYCOSTATIN ) Apply 1 Application topically 2 (two) times daily as needed for dry skin.     ondansetron  (ZOFRAN ) 4 MG tablet Take 1 tablet (4 mg total) by mouth every 8 (eight) hours as needed for nausea or vomiting. 20 tablet 0   pantoprazole  (PROTONIX ) 20 MG tablet Take 1 tablet (20 mg total) by mouth daily as needed. 30 tablet 2   tirzepatide  (ZEPBOUND ) 2.5 MG/0.5ML Pen Inject 2.5 mg into the skin once a week. Inject 0.25 mg [place] weekly for four weeks, then increase to 0.5 mg weekly 2 mL 0   tirzepatide  (ZEPBOUND ) 5 MG/0.5ML Pen Inject 5 mg into the skin once a week. Inject 0.5 mg [place] weekly 2 mL 0   tirzepatide  (ZEPBOUND ) 7.5 MG/0.5ML Pen Inject 7.5 mg into the skin once a week. 2 mL 1   No facility-administered medications prior to visit.      ROS:  Review of Systems BREAST: No symptoms   OBJECTIVE:   Vitals:  There were no vitals taken for this visit.  Physical Exam  Results: No results found for this or any previous visit (from the past 24 hours).   Assessment/Plan: No  diagnosis found.    No orders of the defined types were placed in this encounter.     No follow-ups on file.  Shermon Bozzi B. Taite Baldassari, PA-C 10/30/2023 8:46 PM

## 2023-10-31 ENCOUNTER — Ambulatory Visit: Admitting: Obstetrics and Gynecology

## 2023-10-31 ENCOUNTER — Encounter: Payer: Self-pay | Admitting: Obstetrics and Gynecology

## 2023-10-31 VITALS — BP 119/76 | HR 83 | Ht 62.0 in | Wt 165.0 lb

## 2023-10-31 DIAGNOSIS — R1031 Right lower quadrant pain: Secondary | ICD-10-CM

## 2023-10-31 DIAGNOSIS — L680 Hirsutism: Secondary | ICD-10-CM

## 2023-10-31 NOTE — Patient Instructions (Signed)
 I value your feedback and you entrusting [place] with your care. If you get a [place] patient survey, I would appreciate you taking the time to let us know about your experience today. Thank you! ? ? ?

## 2023-11-06 ENCOUNTER — Encounter: Payer: Self-pay | Admitting: Family

## 2023-11-12 ENCOUNTER — Encounter: Payer: Self-pay | Admitting: Family

## 2023-11-12 ENCOUNTER — Ambulatory Visit: Admitting: Family

## 2023-11-12 VITALS — BP 128/84 | HR 78 | Temp 98.1°F | Ht 62.5 in | Wt 164.0 lb

## 2023-11-12 DIAGNOSIS — E7800 Pure hypercholesterolemia, unspecified: Secondary | ICD-10-CM

## 2023-11-12 DIAGNOSIS — R7303 Prediabetes: Secondary | ICD-10-CM | POA: Diagnosis not present

## 2023-11-12 DIAGNOSIS — R21 Rash and other nonspecific skin eruption: Secondary | ICD-10-CM

## 2023-11-12 DIAGNOSIS — R1032 Left lower quadrant pain: Secondary | ICD-10-CM | POA: Diagnosis not present

## 2023-11-12 DIAGNOSIS — Z1231 Encounter for screening mammogram for malignant neoplasm of breast: Secondary | ICD-10-CM

## 2023-11-12 LAB — COMPREHENSIVE METABOLIC PANEL WITH GFR
ALT: 13 U/L (ref 0–35)
AST: 16 U/L (ref 0–37)
Albumin: 4.5 g/dL (ref 3.5–5.2)
Alkaline Phosphatase: 54 U/L (ref 39–117)
BUN: 9 mg/dL (ref 6–23)
CO2: 26 meq/L (ref 19–32)
Calcium: 9.4 mg/dL (ref 8.4–10.5)
Chloride: 103 meq/L (ref 96–112)
Creatinine, Ser: 0.93 mg/dL (ref 0.40–1.20)
GFR: 74.89 mL/min (ref >60.00)
Glucose, Bld: 108 mg/dL — ABNORMAL HIGH (ref 70–99)
Potassium: 4 meq/L (ref 3.5–5.1)
Sodium: 136 meq/L (ref 135–145)
Total Bilirubin: 0.3 mg/dL (ref 0.2–1.2)
Total Protein: 7.6 g/dL (ref 6.0–8.3)

## 2023-11-12 LAB — LIPID PANEL
Cholesterol: 181 mg/dL (ref 0–200)
HDL: 51.1 mg/dL (ref >39.00)
LDL Cholesterol: 101 mg/dL — ABNORMAL HIGH (ref 0–99)
NonHDL: 129.42
Total CHOL/HDL Ratio: 4
Triglycerides: 143 mg/dL (ref 0.0–149.0)
VLDL: 28.6 mg/dL (ref 0.0–40.0)

## 2023-11-12 LAB — HEMOGLOBIN A1C: Hgb A1c MFr Bld: 6.3 % (ref 4.6–6.5)

## 2023-11-12 MED ORDER — ECONAZOLE NITRATE 1 % EX CREA: TOPICAL_CREAM | Freq: Every day | CUTANEOUS | 0 refills | Status: AC

## 2023-11-12 NOTE — Progress Notes (Signed)
 t  Established Patient Office Visit  Subjective:      CC:  Chief Complaint  Patient presents with   Acute Visit    Having pain in right lower abdomen, fees like she might have pulled a muscle. Had an ultrasound and it didn't find anything abnormal.    HPI: Tina Schmidt is a 43 y.o. female presenting on 11/12/2023 for Acute Visit (Having pain in right lower abdomen, fees like she might have pulled a muscle. Had an ultrasound and it didn't find anything abnormal.) .  Discussed the use of AI scribe software for clinical note transcription with the patient, who gave verbal consent to proceed.  History of Present Illness [name] is a 43 year old female who presents with right lower quadrant abdominal pain.  She has been experiencing a constant dull ache in the right lower quadrant of her abdomen for the past seven weeks, located near her hip bone and described as deep. There was one episode of sharp pain while driving, which she attributes to being on her period at the time. The pain improves with the use of a heating pad. No urinary or vaginal symptoms, fevers, or changes in bowel habits related to this pain.  She has a history of a 1.9 cm septated right ovarian cyst noted on an ultrasound in April 2025, and a gynecological ultrasound on October 10, 2023, showed multiple follicles. She has been on birth control for a few months in the past. Her menses are regular, occurring monthly, lasting seven days with moderate flow and clots, and are improved with NSAIDs. She experiences nausea one week before her menses, which is not related to the abdominal pain. She has a history of three C-sections with scarring.  She reports a history of excessive belching and episodes of reflux and epigastric abdominal pain, which have resolved. Currently, she is experiencing constipation, having bowel movements twice a week, with recent hard stools. She takes stool softeners daily due to iron  supplementation and uses Miralax to aid regularity. No fevers, bloody stools, or greasy stools. Occasional nausea unrelated to her menstrual cycle.  She is sexually active and experienced right lower quadrant pain one month ago after intercourse. Her gynecologist ruled out pelvic causes.         Social history:  Relevant past medical, surgical, family and social history reviewed and updated as indicated. Interim medical history since our last visit reviewed.  Allergies and medications reviewed and updated.  DATA REVIEWED: CHART IN EPIC     ROS: Negative unless specifically indicated above in HPI.    Current Outpatient Medications:    atorvastatin  (LIPITOR) 10 MG tablet, Take 1 tablet by mouth daily., Disp: 90 tablet, Rfl: 2   buPROPion  (WELLBUTRIN  XL) 150 MG 24 hr tablet, Take 1 tablet (150 mg total) by mouth daily., Disp: 90 tablet, Rfl: 3   Cholecalciferol (VITAMIN D3) 50 MCG (2000 UT) TABS, Take 1 tablet by mouth daily., Disp: , Rfl:    clobetasol (TEMOVATE) 0.05 % external solution, PRN, Disp: , Rfl:    cyclobenzaprine  (FLEXERIL ) 10 MG tablet, Take 1 tablet (10 mg total) by mouth 3 (three) times daily as needed for muscle spasms., Disp: 30 tablet, Rfl: 0   econazole nitrate 1 % cream, Apply topically daily., Disp: 15 g, Rfl: 0   ferrous sulfate 325 (65 FE) MG tablet, Take 325 mg by mouth daily with breakfast., Disp: , Rfl:    gabapentin  (NEURONTIN ) 100 MG capsule, Take 1 capsule (  100 mg total) by mouth 3 (three) times daily., Disp: 90 capsule, Rfl: 3   hydrocortisone  (ANUSOL -HC) 2.5 % rectal cream, Place 1 Application rectally 2 (two) times daily as needed for hemorrhoids or anal itching., Disp: , Rfl:    ketoconazole  (NIZORAL ) 2 % shampoo, Apply 1 Application topically. 2 times weekly as needed, Disp: , Rfl:    nystatin  cream (MYCOSTATIN ), Apply 1 Application topically 2 (two) times daily as needed for dry skin., Disp: , Rfl:    ondansetron  (ZOFRAN ) 4 MG tablet, Take 1  tablet (4 mg total) by mouth every 8 (eight) hours as needed for nausea or vomiting., Disp: 20 tablet, Rfl: 0   pantoprazole  (PROTONIX ) 20 MG tablet, Take 1 tablet (20 mg total) by mouth daily as needed., Disp: 30 tablet, Rfl: 2        Objective:        BP 128/84 (BP Location: Right Arm, Patient Position: Sitting, Cuff Size: Normal)   Pulse 78   Temp 98.1 F (36.7 C) (Temporal)   Ht 5' 2.5 (1.588 m)   Wt 164 lb (74.4 kg)   LMP 10/19/2023 (Exact Date)   SpO2 100%   BMI 29.52 kg/m   Physical Exam BREAST: Circular, scaly rash on the breast skin. ABDOMEN: Tenderness in the right lower quadrant.  Wt Readings from Last 3 Encounters:  11/12/23 164 lb (74.4 kg)  10/31/23 165 lb (74.8 kg)  08/15/23 166 lb (75.3 kg)    Physical Exam Vitals reviewed.  Abdominal:     General: Bowel sounds are normal.     Tenderness: There is abdominal tenderness in the right lower quadrant and left lower quadrant. There is no guarding or rebound. Positive signs include McBurney's sign and psoas sign. Negative signs include Rovsing's sign.          Results RADIOLOGY Gynecology ultrasound: Multiple follicles (10/10/2023) Septated right ovarian cyst: 1.9 cm (04/2023)  Assessment & Plan:   Assessment and Plan Assessment & Plan Right lower quadrant abdominal pain, rule out appendicitis Persistent right lower quadrant abdominal pain for one month, constant with occasional sharp episodes, worsens when lying down. No urinary or vaginal symptoms, no fever. Differential includes appendicitis, adhesions, and musculoskeletal causes. CT scan ordered to rule out appendicitis and other causes. - Ordered CT scan of the abdomen and pelvis with contrast to rule out appendicitis and other causes. - Advised to self-schedule CT scan after fasting for four hours. - Ordered labs including A1c and liver function tests. -ER precautions advised   Chronic idiopathic constipation Chronic constipation with bowel  movements twice a week, hard stools, and occasional nausea. Likely chronic idiopathic constipation. Discussed potential use of Linzess or Trulance for regulation. Emphasized importance of hydration, fiber intake, and regular exercise. - Recommended Miralax one tablespoon daily to achieve one soft bowel movement per day. - Continue stool softeners and consider switching to slow FE every other day. - Increase fiber intake through diet and consider probiotics like Florastor. - Encouraged regular exercise and adequate hydration. - Will consider Linzess or Trulance if constipation persists.   Skin rash, possible tinea corporis Circular, scaly rash on the breast, possibly tinea corporis. No tenderness or itching. Previous use of nystatin  cream. Discussed trial of econazole cream and potential switch to steroid cream if ineffective. - Prescribed econazole cream (Spectazol) for antifungal treatment. - If rash does not improve, will consider switching to a steroid cream. - If no improvement with steroid cream, will refer to dermatology.  General Health Maintenance Routine  health maintenance discussed, including mammogram scheduling and cholesterol monitoring. She is active and monitoring her health closely. - Scheduled mammogram after November 4th. - Ordered labs to check cholesterol and A1c levels.        Return in about 6 months (around 05/12/2024) for f/u CPE.     Ginger Patrick, MSN, APRN, FNP-C [facility]

## 2023-11-12 NOTE — Patient Instructions (Signed)
 ------------------------------------   Add fiber supplement once daily.  Add a probiotic (such as Florastor) daily. Drink 64 oz of water a day. Eat lots of fresh fruit and veggies. Ensure regular exercise.    If you are not able to have regular BM's with the above regimen, you may add miralax 1 tablespoon daily.  Increase or decrease amount/frequency as needed to ensure 1 soft BM/day.   ------------------------------------   

## 2023-11-13 ENCOUNTER — Ambulatory Visit: Payer: Self-pay | Admitting: Family

## 2023-11-13 DIAGNOSIS — R102 Pelvic and perineal pain unspecified side: Secondary | ICD-10-CM

## 2023-11-14 ENCOUNTER — Ambulatory Visit
Admission: RE | Admit: 2023-11-14 | Discharge: 2023-11-14 | Disposition: A | Discharge status: Home / Self Care | Source: Ambulatory Visit | Attending: Family | Admitting: Family

## 2023-11-14 DIAGNOSIS — R1032 Left lower quadrant pain: Secondary | ICD-10-CM | POA: Insufficient documentation

## 2023-11-14 DIAGNOSIS — R1031 Right lower quadrant pain: Secondary | ICD-10-CM | POA: Diagnosis not present

## 2023-11-14 DIAGNOSIS — K760 Fatty (change of) liver, not elsewhere classified: Secondary | ICD-10-CM | POA: Diagnosis not present

## 2023-11-14 MED ORDER — IOHEXOL 9 MG/ML PO SOLN
500.0000 mL | ORAL | Status: AC
  Administered 2023-11-14: 500 mL via ORAL

## 2023-11-14 MED ORDER — IOHEXOL 300 MG/ML  SOLN
80.0000 mL | Freq: Once | INTRAMUSCULAR | Status: AC | PRN
  Administered 2023-11-14: 80 mL via INTRAVENOUS

## 2023-11-14 NOTE — Progress Notes (Signed)
 Tina Schmidt,   Mutual patient of ours with persistent right lower pelvic pain.  I ordered a CT scan and there was suggestion of the right tubal ligation clip having potentially migrated into the right lower anterior pelvis. I imagine this could make sense for her pain she has been feeling?   Would this be something I'd have her see you for or should I send her to general surgeon? Thanks in advance.    Regards,   Ginger Patrick FNP-C

## 2023-11-17 NOTE — Progress Notes (Signed)
 I will discuss with one of the doctors on Tues when I'm back in the office to determine best management option for this patient. Glad you did the CT. Thank you for letting me know.

## 2023-11-18 ENCOUNTER — Encounter: Payer: Self-pay | Admitting: Obstetrics and Gynecology

## 2023-11-18 MED ORDER — IBUPROFEN 600 MG PO TABS: 600.0000 mg | ORAL_TABLET | Freq: Three times a day (TID) | ORAL | 0 refills | Status: AC | PRN

## 2023-11-19 NOTE — Telephone Encounter (Signed)
 Pls call pt to schedule with [facility] for dx lap consult. Per [place], not an emergency. Hope she can get in in Dec.

## 2023-11-29 DIAGNOSIS — R399 Unspecified symptoms and signs involving the genitourinary system: Secondary | ICD-10-CM | POA: Diagnosis not present

## 2023-12-26 ENCOUNTER — Ambulatory Visit
Admission: RE | Admit: 2023-12-26 | Discharge: 2023-12-26 | Disposition: A | Discharge status: Home / Self Care | Source: Ambulatory Visit | Attending: Family | Admitting: Family

## 2023-12-26 DIAGNOSIS — Z1231 Encounter for screening mammogram for malignant neoplasm of breast: Secondary | ICD-10-CM | POA: Diagnosis not present

## 2024-01-06 ENCOUNTER — Encounter: Payer: Self-pay | Admitting: Family

## 2024-01-06 DIAGNOSIS — R7303 Prediabetes: Secondary | ICD-10-CM

## 2024-01-07 ENCOUNTER — Ambulatory Visit: Admitting: Obstetrics & Gynecology

## 2024-01-07 MED ORDER — METFORMIN HCL ER 500 MG PO TB24: 500.0000 mg | ORAL_TABLET | Freq: Every day | ORAL | Status: AC

## 2024-01-29 NOTE — Progress Notes (Unsigned)
" ° ° °  GYNECOLOGY PROGRESS NOTE  Subjective:  PCP: Corwin Antu, FNP  Patient ID: Tina Schmidt, female    DOB: April 23, 1980, 44 y.o.   MRN: [id number]  HPI  Patient is a 44 y.o. [id number] female who presents diagnostic laparoscopy consult, per Bernarda Copland PA-C.  Patient was seen here 10/31/23 for daily RLQ pain.  Pain was thought not to be gyn related, but she had a CT scan of her pelvis 11/14/23 at which time they noted her right tubal ligation clip was located in the right low anterior pelvis.  Left clip was in expected position.  BTL 08/26/2012.  {Common ambulatory SmartLinks:19316}  Review of Systems {ros; complete:30496}   Objective:   There were no vitals taken for this visit. There is no height or weight on file to calculate BMI.  General appearance: {general exam:16600} Abdomen: {abdominal exam:16834} Pelvic: {pelvic exam:16852::cervix normal in appearance,external genitalia normal,no adnexal masses or tenderness,no cervical motion tenderness,rectovaginal septum normal,uterus normal size, shape, and consistency,vagina normal without discharge} Extremities: {extremity exam:5109} Neurologic: {neuro exam:17854}   Assessment/Plan:   No diagnosis found.   There are no diagnoses linked to this encounter.     Estil Mangle, DO [facility]/  "

## 2024-02-04 ENCOUNTER — Ambulatory Visit: Payer: Self-pay | Admitting: Obstetrics
